# Patient Record
Sex: Female | Born: 1990 | Race: Black or African American | Hispanic: No | Marital: Single | Smoking: Current some day smoker
Health system: Southern US, Community
[De-identification: ages and names within clinical notes are randomized; demographics above are authoritative.]

## PROBLEM LIST (undated history)

## (undated) DIAGNOSIS — E119 Type 2 diabetes mellitus without complications: Secondary | ICD-10-CM

## (undated) DIAGNOSIS — J45909 Unspecified asthma, uncomplicated: Secondary | ICD-10-CM

---

## 2013-04-01 ENCOUNTER — Encounter (HOSPITAL_BASED_OUTPATIENT_CLINIC_OR_DEPARTMENT_OTHER): Payer: Self-pay | Admitting: Emergency Medicine

## 2013-04-01 ENCOUNTER — Emergency Department (HOSPITAL_BASED_OUTPATIENT_CLINIC_OR_DEPARTMENT_OTHER)
Admission: EM | Admit: 2013-04-01 | Discharge: 2013-04-01 | Disposition: A | Discharge status: Home / Self Care | Payer: Medicaid Other | Attending: Emergency Medicine | Admitting: Emergency Medicine

## 2013-04-01 DIAGNOSIS — E119 Type 2 diabetes mellitus without complications: Secondary | ICD-10-CM | POA: Insufficient documentation

## 2013-04-01 DIAGNOSIS — A499 Bacterial infection, unspecified: Secondary | ICD-10-CM | POA: Insufficient documentation

## 2013-04-01 DIAGNOSIS — J45909 Unspecified asthma, uncomplicated: Secondary | ICD-10-CM | POA: Insufficient documentation

## 2013-04-01 DIAGNOSIS — Z3202 Encounter for pregnancy test, result negative: Secondary | ICD-10-CM | POA: Insufficient documentation

## 2013-04-01 DIAGNOSIS — F17200 Nicotine dependence, unspecified, uncomplicated: Secondary | ICD-10-CM | POA: Insufficient documentation

## 2013-04-01 DIAGNOSIS — N760 Acute vaginitis: Secondary | ICD-10-CM | POA: Insufficient documentation

## 2013-04-01 DIAGNOSIS — B9689 Other specified bacterial agents as the cause of diseases classified elsewhere: Secondary | ICD-10-CM | POA: Insufficient documentation

## 2013-04-01 HISTORY — DX: Type 2 diabetes mellitus without complications: E11.9

## 2013-04-01 HISTORY — DX: Unspecified asthma, uncomplicated: J45.909

## 2013-04-01 LAB — URINALYSIS, ROUTINE W REFLEX MICROSCOPIC
Bilirubin Urine: NEGATIVE
GLUCOSE, UA: NEGATIVE mg/dL
Hgb urine dipstick: NEGATIVE
KETONES UR: NEGATIVE mg/dL
LEUKOCYTES UA: NEGATIVE
NITRITE: NEGATIVE
PROTEIN: NEGATIVE mg/dL
Specific Gravity, Urine: 1.026 (ref 1.005–1.030)
UROBILINOGEN UA: 1 mg/dL (ref 0.0–1.0)
pH: 6.5 (ref 5.0–8.0)

## 2013-04-01 LAB — WET PREP, GENITAL
Trich, Wet Prep: NONE SEEN
YEAST WET PREP: NONE SEEN

## 2013-04-01 LAB — PREGNANCY, URINE: Preg Test, Ur: NEGATIVE

## 2013-04-01 MED ORDER — METRONIDAZOLE 0.75 % VA GEL: 1.0000 | Freq: Two times a day (BID) | VAGINAL | Status: DC

## 2013-04-01 NOTE — Discharge Instructions (Signed)

## 2013-04-01 NOTE — ED Notes (Signed)
Vaginal d/c x 1 week  

## 2013-04-01 NOTE — ED Provider Notes (Signed)
CSN: [id number]     Arrival date & time 04/01/13  1302 History   First MD Initiated Contact with Patient 04/01/13 1501     Chief Complaint  Patient presents with  . Vaginal Discharge   (Consider location/radiation/quality/duration/timing/severity/associated sxs/prior Treatment) Patient is a 23 y.o. female presenting with vaginal discharge. The history is provided by the patient. No language interpreter was used.  Vaginal Discharge Quality:  White Severity:  Moderate Onset quality:  Gradual Duration:  2 days Timing:  Constant Progression:  Worsening Chronicity:  New Context: after intercourse   Relieved by:  Nothing Worsened by:  Nothing tried Ineffective treatments:  None tried Associated symptoms: no abdominal pain     Past Medical History  Diagnosis Date  . Diabetes mellitus without complication   . Asthma    History reviewed. No pertinent past surgical history. No family history on file. History  Substance Use Topics  . Smoking status: Current Every Day Smoker -- 0.50 packs/day    Types: Cigarettes  . Smokeless tobacco: Not on file  . Alcohol Use: Yes     Comment: occasional   OB History   Grav Para Term Preterm Abortions TAB SAB Ect Mult Living                 Review of Systems  Gastrointestinal: Negative for abdominal pain.  Genitourinary: Positive for vaginal discharge.  All other systems reviewed and are negative.    Allergies  Review of patient's allergies indicates no known allergies.  Home Medications  No current outpatient prescriptions on file. BP 126/68  Pulse 84  Temp(Src) 98.9 F (37.2 C) (Oral)  Resp 16  Ht 5\' 4"  (1.626 m)  Wt 170 lb (77.111 kg)  BMI 29.17 kg/m2  SpO2 100%  LMP 03/19/2013 Physical Exam  Nursing note and vitals reviewed. Constitutional: She appears well-developed and well-nourished.  HENT:  Head: Normocephalic and atraumatic.  Eyes: Pupils are equal, round, and reactive to light.  Neck: Normal range of motion.   Cardiovascular: Normal rate and regular rhythm.   Pulmonary/Chest: Effort normal and breath sounds normal.  Abdominal: Soft.  Genitourinary: Vaginal discharge found.  Musculoskeletal: Normal range of motion.  Neurological: She is alert.  Skin: Skin is warm.    ED Course  Procedures (including critical care time) Labs Review Labs Reviewed  WET PREP, GENITAL - Abnormal; Notable for the following:    Clue Cells Wet Prep HPF POC MANY (*)    WBC, Wet Prep HPF POC TOO NUMEROUS TO COUNT (*)    All other components within normal limits  GC/CHLAMYDIA PROBE AMP  URINALYSIS, ROUTINE W REFLEX MICROSCOPIC  PREGNANCY, URINE   Imaging Review No results found.  EKG Interpretation   None       MDM   1. BV (bacterial vaginosis)    metrogel vaginal  Elson AreasLeslie K Pedro Oldenburg, PA-C 04/01/13 1625

## 2013-04-02 LAB — GC/CHLAMYDIA PROBE AMP
CT Probe RNA: NEGATIVE
GC Probe RNA: NEGATIVE

## 2013-04-02 NOTE — ED Provider Notes (Signed)
History/physical exam/procedure(s) were performed by non-physician practitioner and as supervising physician I was immediately available for consultation/collaboration. I have reviewed all notes and am in agreement with care and plan.   Hilario Quarryanielle S Oneida Mckamey, MD 04/02/13 1400

## 2015-02-13 ENCOUNTER — Encounter (HOSPITAL_BASED_OUTPATIENT_CLINIC_OR_DEPARTMENT_OTHER): Payer: Self-pay | Admitting: *Deleted

## 2015-02-13 ENCOUNTER — Emergency Department (HOSPITAL_BASED_OUTPATIENT_CLINIC_OR_DEPARTMENT_OTHER)
Admission: EM | Admit: 2015-02-13 | Discharge: 2015-02-13 | Disposition: A | Discharge status: Home / Self Care | Payer: No Typology Code available for payment source | Attending: Emergency Medicine | Admitting: Emergency Medicine

## 2015-02-13 DIAGNOSIS — Y9389 Activity, other specified: Secondary | ICD-10-CM | POA: Insufficient documentation

## 2015-02-13 DIAGNOSIS — E119 Type 2 diabetes mellitus without complications: Secondary | ICD-10-CM | POA: Diagnosis not present

## 2015-02-13 DIAGNOSIS — J45909 Unspecified asthma, uncomplicated: Secondary | ICD-10-CM | POA: Insufficient documentation

## 2015-02-13 DIAGNOSIS — Y998 Other external cause status: Secondary | ICD-10-CM | POA: Insufficient documentation

## 2015-02-13 DIAGNOSIS — Z3202 Encounter for pregnancy test, result negative: Secondary | ICD-10-CM | POA: Insufficient documentation

## 2015-02-13 DIAGNOSIS — Y92410 Unspecified street and highway as the place of occurrence of the external cause: Secondary | ICD-10-CM | POA: Insufficient documentation

## 2015-02-13 DIAGNOSIS — Z792 Long term (current) use of antibiotics: Secondary | ICD-10-CM | POA: Diagnosis not present

## 2015-02-13 DIAGNOSIS — F17210 Nicotine dependence, cigarettes, uncomplicated: Secondary | ICD-10-CM | POA: Insufficient documentation

## 2015-02-13 DIAGNOSIS — S199XXA Unspecified injury of neck, initial encounter: Secondary | ICD-10-CM | POA: Diagnosis present

## 2015-02-13 DIAGNOSIS — S161XXA Strain of muscle, fascia and tendon at neck level, initial encounter: Secondary | ICD-10-CM | POA: Insufficient documentation

## 2015-02-13 LAB — PREGNANCY, URINE: PREG TEST UR: NEGATIVE

## 2015-02-13 MED ORDER — IBUPROFEN 800 MG PO TABS
800.0000 mg | ORAL_TABLET | Freq: Once | ORAL | Status: AC
  Administered 2015-02-13: 800 mg via ORAL
  Filled 2015-02-13: qty 1
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
## 2015-02-13 NOTE — ED Notes (Signed)
Patient stable and ambulatory. Patient verbalizes understanding of discharge instructions and follow-up. 

## 2017-04-16 ENCOUNTER — Emergency Department (HOSPITAL_BASED_OUTPATIENT_CLINIC_OR_DEPARTMENT_OTHER): Payer: BLUE CROSS/BLUE SHIELD

## 2017-04-16 ENCOUNTER — Other Ambulatory Visit: Payer: Self-pay

## 2017-04-16 ENCOUNTER — Emergency Department (HOSPITAL_BASED_OUTPATIENT_CLINIC_OR_DEPARTMENT_OTHER)
Admission: EM | Admit: 2017-04-16 | Discharge: 2017-04-16 | Disposition: A | Discharge status: Home / Self Care | Payer: BLUE CROSS/BLUE SHIELD | Attending: Emergency Medicine | Admitting: Emergency Medicine

## 2017-04-16 ENCOUNTER — Encounter (HOSPITAL_BASED_OUTPATIENT_CLINIC_OR_DEPARTMENT_OTHER): Payer: Self-pay | Admitting: *Deleted

## 2017-04-16 DIAGNOSIS — E119 Type 2 diabetes mellitus without complications: Secondary | ICD-10-CM | POA: Diagnosis not present

## 2017-04-16 DIAGNOSIS — J45909 Unspecified asthma, uncomplicated: Secondary | ICD-10-CM | POA: Insufficient documentation

## 2017-04-16 DIAGNOSIS — B9789 Other viral agents as the cause of diseases classified elsewhere: Secondary | ICD-10-CM

## 2017-04-16 DIAGNOSIS — R05 Cough: Secondary | ICD-10-CM | POA: Diagnosis present

## 2017-04-16 DIAGNOSIS — J069 Acute upper respiratory infection, unspecified: Secondary | ICD-10-CM | POA: Insufficient documentation

## 2017-04-16 DIAGNOSIS — F17210 Nicotine dependence, cigarettes, uncomplicated: Secondary | ICD-10-CM | POA: Insufficient documentation

## 2017-04-16 NOTE — Discharge Instructions (Signed)
Your symptoms are likely caused by an upper respiratory viral infection, chest x-ray shows no evidence of pneumonia, antibiotics are not helpful for bilateral injections please treat your symptoms supportively, make sure you are drinking plenty of fluids, ibuprofen or Tylenol for pain or fevers, Flonase and Zyrtec to help with nasal congestion, and over-the-counter cough syrups and throat lozenges to help with cough.  Please follow-up with primary care if symptoms are not improving after 5-7 days, if you have persistent fevers, chest pain, shortness of breath or other new or concerning symptoms return to the ED for reevaluation.

## 2017-04-16 NOTE — ED Provider Notes (Signed)
[facility] EMERGENCY DEPARTMENT Provider Note   CSN: [id number] Arrival date & time: 04/16/17  1721     History   Chief Complaint Chief Complaint  Patient presents with  . URI    HPI   Alexis Melendez is a 27 y.o. Female with a history of asthma and diabetes, presents to the ED for evaluation of 3 days of URI symptoms.  Since onset symptoms have been constant and gradually worsening.  Patient reports some chills but no fevers.  Patient has had runny nose and nasal congestion cough occasionally productive of mucus, sore and scratchy throat, and some ear pain, congestion is worse on the left than the right.  Patient reports muscle soreness from coughing, but denies any chest pain or shortness of breath.  No nausea, vomiting or diarrhea no abdominal pain.  Has tried Mucinex without improvement has not tried anything else to treat her symptoms, denies any other aggravating or alleviating factors.      Past Medical History:  Diagnosis Date  . Asthma   . Diabetes mellitus without complication (HCC)    pt states she is not diabetic    There are no active problems to display for this patient.   History reviewed. No pertinent surgical history.  OB History    No data available       Home Medications    Prior to Admission medications   Not on File    Family History History reviewed. No pertinent family history.  Social History Social History   Tobacco Use  . Smoking status: Current Every Day Smoker    Packs/day: 0.50    Types: Cigarettes  . Smokeless tobacco: Never Used  Substance Use Topics  . Alcohol use: Yes    Comment: occasional  . Drug use: Yes    Types: Marijuana     Allergies   Patient has no known allergies.   Review of Systems Review of Systems  Constitutional: Positive for chills. Negative for fever.  HENT: Positive for congestion, ear pain, rhinorrhea, sinus pressure, sinus pain and sore throat. Negative for trouble swallowing.     Eyes: Negative for discharge, redness and itching.  Respiratory: Positive for cough. Negative for chest tightness, shortness of breath and wheezing.   Cardiovascular: Negative for chest pain.  Gastrointestinal: Negative for abdominal pain, diarrhea, nausea and vomiting.  Musculoskeletal: Negative for neck pain.  Skin: Negative for rash.  Neurological: Negative for headaches.     Physical Exam Updated Vital Signs BP 128/66 (BP Location: Right Arm)   Pulse 84   Temp 98.6 F (37 C)   Resp 18   Ht 5\' 4"  (1.626 m)   Wt 90.7 kg (200 lb)   LMP 04/09/2017   SpO2 99%   BMI 34.33 kg/m   Physical Exam  Constitutional: She appears well-developed and well-nourished. No distress.  HENT:  Head: Normocephalic and atraumatic.  TMs clear with good landmarks, moderate nasal mucosa edema with clear rhinorrhea, posterior oropharynx clear and moist, with some erythema, no edema or exudates, uvula midline  Eyes: Right eye exhibits no discharge. Left eye exhibits no discharge.  Neck: Normal range of motion. Neck supple.  Cardiovascular: Normal rate, regular rhythm and normal heart sounds.  Pulmonary/Chest: Effort normal and breath sounds normal. No stridor. No respiratory distress. She has no wheezes. She has no rales.  Respirations equal and unlabored, patient able to speak in full sentences, lungs clear to auscultation bilaterally  Abdominal: Soft. Bowel sounds are normal. She exhibits no  distension and no mass. There is no tenderness.  Musculoskeletal: She exhibits no edema.  Lymphadenopathy:    She has no cervical adenopathy.  Neurological: She is alert. Coordination normal.  Skin: Skin is warm and dry. Capillary refill takes less than 2 seconds. She is not diaphoretic.  Psychiatric: She has a normal mood and affect. Her behavior is normal.  Nursing note and vitals reviewed.    ED Treatments / Results  Labs (all labs ordered are listed, but only abnormal results are displayed) Labs  Reviewed - No data to display  EKG  EKG Interpretation None       Radiology Dg Chest 2 View  Result Date: 04/16/2017 CLINICAL DATA:  27 year old female with a history of upper respiratory infection for 3 days EXAM: CHEST  2 VIEW COMPARISON:  None. FINDINGS: The heart size and mediastinal contours are within normal limits. Both lungs are clear. The visualized skeletal structures are unremarkable. IMPRESSION: Negative for acute cardiopulmonary disease Electronically Signed   By: Gilmer MorJaime  Wagner D.O.   On: 04/16/2017 21:43    Procedures Procedures (including critical care time)  Medications Ordered in ED Medications - No data to display   Initial Impression / Assessment and Plan / ED Course  I have reviewed the triage vital signs and the nursing notes.  Pertinent labs & imaging results that were available during my care of the patient were reviewed by me and considered in my medical decision making (see chart for details).  Pt presents with nasal congestion and cough. Pt is well appearing and vitals are normal. Lungs CTA on exam. Pt CXR negative for acute infiltrate. Patients symptoms are consistent with URI, likely viral etiology, city is onset less likely that this is the flu, regardless patient is outside the 48-hour window.. Discussed that antibiotics are not indicated for viral infections. Pt will be discharged with symptomatic treatment.  Verbalizes understanding and is agreeable with plan. Pt is hemodynamically stable & in NAD prior to dc.   Final Clinical Impressions(s) / ED Diagnoses   Final diagnoses:  Viral URI with cough    ED Discharge Orders    None       Legrand RamsFord, Kelsey N, PA-C 04/16/17 2242    Tegeler, Canary Brimhristopher J, MD 04/17/17 [phone]

## 2017-04-16 NOTE — ED Triage Notes (Signed)
Pt c/o URi symptoms x 3 days  

## 2017-06-20 ENCOUNTER — Encounter (HOSPITAL_BASED_OUTPATIENT_CLINIC_OR_DEPARTMENT_OTHER): Payer: Self-pay

## 2017-06-20 ENCOUNTER — Other Ambulatory Visit: Payer: Self-pay

## 2017-06-20 ENCOUNTER — Emergency Department (HOSPITAL_BASED_OUTPATIENT_CLINIC_OR_DEPARTMENT_OTHER)
Admission: EM | Admit: 2017-06-20 | Discharge: 2017-06-20 | Disposition: A | Discharge status: Home / Self Care | Payer: BLUE CROSS/BLUE SHIELD | Attending: Emergency Medicine | Admitting: Emergency Medicine

## 2017-06-20 DIAGNOSIS — N760 Acute vaginitis: Secondary | ICD-10-CM | POA: Diagnosis not present

## 2017-06-20 DIAGNOSIS — R1030 Lower abdominal pain, unspecified: Secondary | ICD-10-CM | POA: Diagnosis not present

## 2017-06-20 DIAGNOSIS — F1210 Cannabis abuse, uncomplicated: Secondary | ICD-10-CM | POA: Diagnosis not present

## 2017-06-20 DIAGNOSIS — J45909 Unspecified asthma, uncomplicated: Secondary | ICD-10-CM | POA: Diagnosis not present

## 2017-06-20 DIAGNOSIS — B9689 Other specified bacterial agents as the cause of diseases classified elsewhere: Secondary | ICD-10-CM | POA: Insufficient documentation

## 2017-06-20 DIAGNOSIS — F17210 Nicotine dependence, cigarettes, uncomplicated: Secondary | ICD-10-CM | POA: Insufficient documentation

## 2017-06-20 DIAGNOSIS — N898 Other specified noninflammatory disorders of vagina: Secondary | ICD-10-CM | POA: Insufficient documentation

## 2017-06-20 DIAGNOSIS — E119 Type 2 diabetes mellitus without complications: Secondary | ICD-10-CM | POA: Insufficient documentation

## 2017-06-20 LAB — WET PREP, GENITAL
Sperm: NONE SEEN
TRICH WET PREP: NONE SEEN
Yeast Wet Prep HPF POC: NONE SEEN

## 2017-06-20 LAB — CBC WITH DIFFERENTIAL/PLATELET
BASOS ABS: 0 10*3/uL (ref 0.0–0.1)
Basophils Relative: 0 %
EOS PCT: 0 %
Eosinophils Absolute: 0 10*3/uL (ref 0.0–0.7)
HCT: 37.2 % (ref 36.0–46.0)
Hemoglobin: 12.4 g/dL (ref 12.0–15.0)
LYMPHS ABS: 2.8 10*3/uL (ref 0.7–4.0)
Lymphocytes Relative: 42 %
MCH: 27.1 pg (ref 26.0–34.0)
MCHC: 33.3 g/dL (ref 30.0–36.0)
MCV: 81.4 fL (ref 78.0–100.0)
MONOS PCT: 8 %
Monocytes Absolute: 0.5 10*3/uL (ref 0.1–1.0)
Neutro Abs: 3.4 10*3/uL (ref 1.7–7.7)
Neutrophils Relative %: 50 %
Platelets: 254 10*3/uL (ref 150–400)
RBC: 4.57 MIL/uL (ref 3.87–5.11)
RDW: 12.4 % (ref 11.5–15.5)
WBC: 6.7 10*3/uL (ref 4.0–10.5)

## 2017-06-20 LAB — BASIC METABOLIC PANEL
ANION GAP: 9 (ref 5–15)
BUN: 9 mg/dL (ref 6–20)
CO2: 21 mmol/L — AB (ref 22–32)
Calcium: 8.6 mg/dL — ABNORMAL LOW (ref 8.9–10.3)
Chloride: 106 mmol/L (ref 101–111)
Creatinine, Ser: 0.81 mg/dL (ref 0.44–1.00)
GFR calc non Af Amer: >60 mL/min (ref >60)
GLUCOSE: 88 mg/dL (ref 65–99)
Potassium: 3.6 mmol/L (ref 3.5–5.1)
Sodium: 136 mmol/L (ref 135–145)

## 2017-06-20 LAB — URINALYSIS, ROUTINE W REFLEX MICROSCOPIC
Bilirubin Urine: NEGATIVE
GLUCOSE, UA: NEGATIVE mg/dL
HGB URINE DIPSTICK: NEGATIVE
Ketones, ur: NEGATIVE mg/dL
Leukocytes, UA: NEGATIVE
Nitrite: NEGATIVE
PH: 6.5 (ref 5.0–8.0)
Protein, ur: NEGATIVE mg/dL
SPECIFIC GRAVITY, URINE: 1.02 (ref 1.005–1.030)

## 2017-06-20 LAB — PREGNANCY, URINE: Preg Test, Ur: NEGATIVE

## 2017-06-20 MED ORDER — METRONIDAZOLE 500 MG PO TABS: 500.0000 mg | ORAL_TABLET | Freq: Two times a day (BID) | ORAL | 0 refills | Status: DC

## 2017-06-20 NOTE — ED Triage Notes (Signed)
Pt presents to the ED with a CC of intermittent abdominal pain (left) that began 4 days ago. NO period in 4 months with negative home HCG test. Pt concerned about possible pregnancy.

## 2017-06-20 NOTE — ED Provider Notes (Signed)
[facility] EMERGENCY DEPARTMENT Provider Note   CSN: [id number] Arrival date & time: 06/20/17  1224     History   Chief Complaint Chief Complaint  Patient presents with  . Abdominal Pain    HPI Alexis Melendez is a 27 y.o. female.  HPI Patient presents with lower abdominal pain.  Has had for the last couple days.  Dull in her lower abdomens.  Comes and goes somewhat.  States she has not had a period in the last 4 months.  States this is somewhat unusual for her.  States she has been treated for BV by the health department.  No diarrhea or constipation.  States she has had a decreased appetite.  Pain not changed with eating.  She states she has had a lot of stress in her life.  Has had a little bit of vaginal discharge and feels as if she could have BV again.  Patient states her brother and her mother are both likely to go to jail for 5 years and she think stress may have some to do with this. Past Medical History:  Diagnosis Date  . Asthma   . Diabetes mellitus without complication (HCC)    pt states she is not diabetic    There are no active problems to display for this patient.   History reviewed. No pertinent surgical history.   OB History   None      Home Medications    Prior to Admission medications   Medication Sig Start Date End Date Taking? Authorizing Provider  metroNIDAZOLE (FLAGYL) 500 MG tablet Take 1 tablet (500 mg total) by mouth 2 (two) times daily. 06/20/17   Benjiman Core, MD    Family History History reviewed. No pertinent family history.  Social History Social History   Tobacco Use  . Smoking status: Current Every Day Smoker    Packs/day: 0.50    Types: Cigarettes  . Smokeless tobacco: Never Used  Substance Use Topics  . Alcohol use: Yes    Comment: occasional  . Drug use: Yes    Types: Marijuana     Allergies   Patient has no known allergies.   Review of Systems Review of Systems  Constitutional: Positive for  appetite change.  HENT: Negative for congestion.   Respiratory: Negative for shortness of breath.   Cardiovascular: Negative for chest pain.  Gastrointestinal: Positive for abdominal pain. Negative for constipation, diarrhea, nausea and vomiting.  Genitourinary: Positive for menstrual problem and vaginal discharge.  Musculoskeletal: Negative for back pain.  Skin: Negative for rash.  Neurological: Negative for weakness.  Hematological: Negative for adenopathy.  Psychiatric/Behavioral: Negative for confusion.     Physical Exam Updated Vital Signs BP 107/66 (BP Location: Right Arm)   Pulse 90   Temp 98.3 F (36.8 C) (Oral)   Resp 18   Ht 5\' 4"  (1.626 m)   Wt 94.8 kg (209 lb)   LMP 04/09/2017   SpO2 99%   BMI 35.87 kg/m   Physical Exam  Constitutional: She appears well-developed.  HENT:  Head: Normocephalic.  Eyes: EOM are normal.  Cardiovascular: Normal rate.  Pulmonary/Chest: Breath sounds normal.  Abdominal: Normal appearance.  Mild suprapubic tenderness without rebound or guarding.  Genitourinary:  Genitourinary Comments: Pelvic exam showed white vaginal discharge.  Did have some erythema at cervical loss.  No cervical motion tenderness or adnexal tenderness.  Neurological: She is alert.  Skin: Skin is warm. Capillary refill takes less than 2 seconds.     ED  Treatments / Results  Labs (all labs ordered are listed, but only abnormal results are displayed) Labs Reviewed  WET PREP, GENITAL - Abnormal; Notable for the following components:      Result Value   Clue Cells Wet Prep HPF POC PRESENT (*)    WBC, Wet Prep HPF POC MODERATE (*)    All other components within normal limits  BASIC METABOLIC PANEL - Abnormal; Notable for the following components:   CO2 21 (*)    Calcium 8.6 (*)    All other components within normal limits  URINALYSIS, ROUTINE W REFLEX MICROSCOPIC  PREGNANCY, URINE  CBC WITH DIFFERENTIAL/PLATELET  RPR  HIV ANTIBODY (ROUTINE TESTING)    PATHOLOGIST SMEAR REVIEW  GC/CHLAMYDIA PROBE AMP ([place]) NOT AT [facility]    EKG None  Radiology No results found.  Procedures Procedures (including critical care time)  Medications Ordered in ED Medications - No data to display   Initial Impression / Assessment and Plan / ED Course  I have reviewed the triage vital signs and the nursing notes.  Pertinent labs & imaging results that were available during my care of the patient were reviewed by me and considered in my medical decision making (see chart for details).     Patient with lower abdominal pain.  Rather benign exam.  Labs reassuring.  Potential bacterial vaginosis.  Will treat.  Discharge home. Final Clinical Impressions(s) / ED Diagnoses   Final diagnoses:  Lower abdominal pain  Bacterial vaginosis    ED Discharge Orders        Ordered    metroNIDAZOLE (FLAGYL) 500 MG tablet  2 times daily     06/20/17 1629       Benjiman CorePickering, Elsey Holts, MD 06/20/17 1646

## 2017-06-20 NOTE — ED Notes (Signed)
Assumed care of patient from Amy, RN. Pt resting quietly. Awaiting Pelvic Exam by EDP.

## 2017-06-21 LAB — HIV ANTIBODY (ROUTINE TESTING W REFLEX): HIV SCREEN 4TH GENERATION: NONREACTIVE

## 2017-06-21 LAB — PATHOLOGIST SMEAR REVIEW: Path Review: REACTIVE

## 2017-06-21 LAB — GC/CHLAMYDIA PROBE AMP (~~LOC~~) NOT AT ARMC
Chlamydia: NEGATIVE
Neisseria Gonorrhea: NEGATIVE

## 2017-06-21 LAB — RPR: RPR Ser Ql: NONREACTIVE

## 2019-01-10 IMAGING — DX DG CHEST 2V
2 series · 2 of 2 positions shown · non-contrast
Comparison: None.

CLINICAL DATA: 26-year-old female with a history of upper
respiratory infection for 3 days

EXAM:
CHEST  2 VIEW

[chest pa]
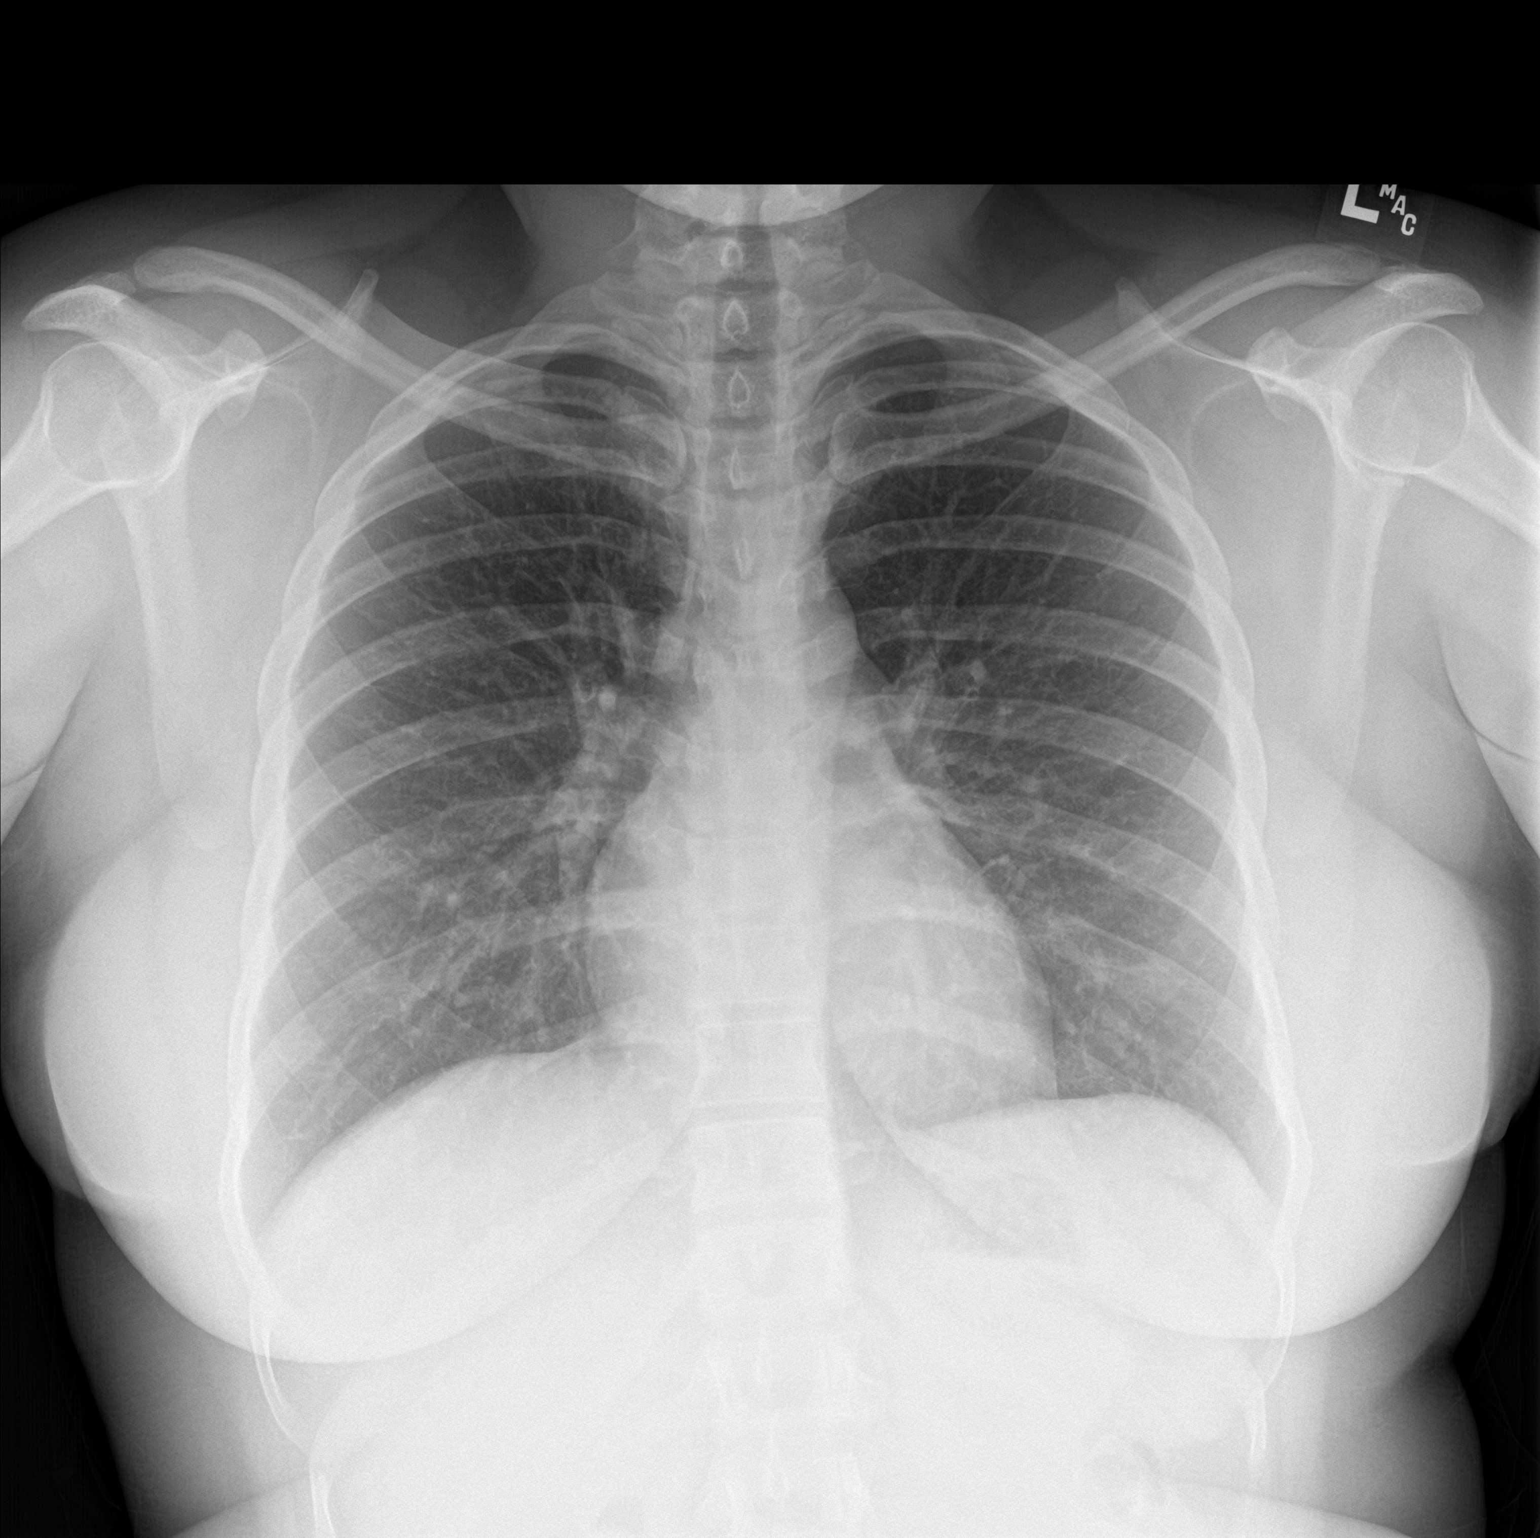

[chest lat]
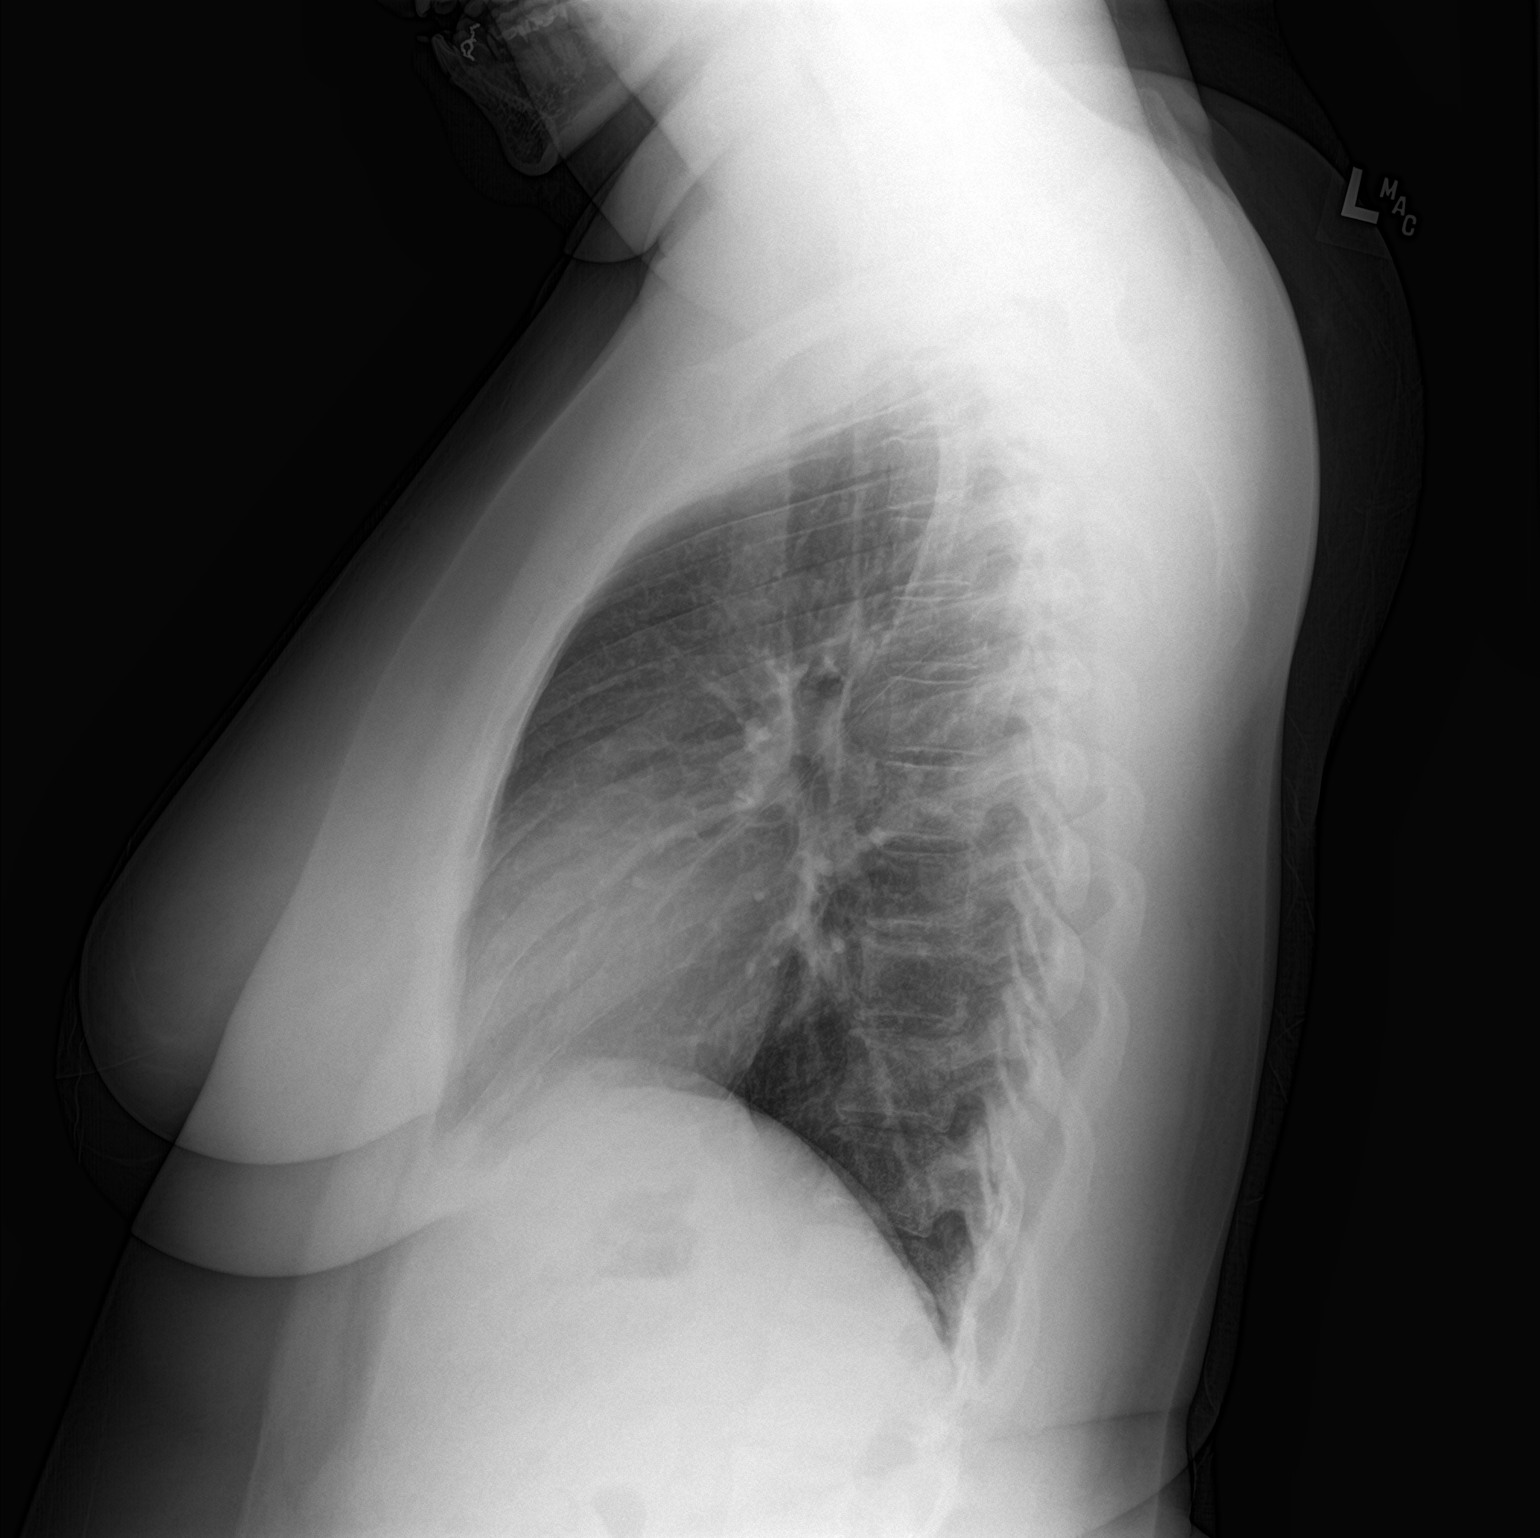

[2 of 2 positions shown; findings below may reference images not displayed]

FINDINGS: The heart size and mediastinal contours are within normal limits.
Both lungs are clear. The visualized skeletal structures are
unremarkable.
IMPRESSION: Negative for acute cardiopulmonary disease

## 2019-08-11 ENCOUNTER — Emergency Department (HOSPITAL_BASED_OUTPATIENT_CLINIC_OR_DEPARTMENT_OTHER)
Admission: EM | Admit: 2019-08-11 | Discharge: 2019-08-11 | Disposition: A | Discharge status: Home / Self Care | Payer: BLUE CROSS/BLUE SHIELD | Attending: Emergency Medicine | Admitting: Emergency Medicine

## 2019-08-11 ENCOUNTER — Other Ambulatory Visit: Payer: Self-pay

## 2019-08-11 ENCOUNTER — Encounter (HOSPITAL_BASED_OUTPATIENT_CLINIC_OR_DEPARTMENT_OTHER): Payer: Self-pay

## 2019-08-11 DIAGNOSIS — Z91040 Latex allergy status: Secondary | ICD-10-CM | POA: Insufficient documentation

## 2019-08-11 DIAGNOSIS — J45909 Unspecified asthma, uncomplicated: Secondary | ICD-10-CM | POA: Insufficient documentation

## 2019-08-11 DIAGNOSIS — A749 Chlamydial infection, unspecified: Secondary | ICD-10-CM | POA: Insufficient documentation

## 2019-08-11 DIAGNOSIS — A5901 Trichomonal vulvovaginitis: Secondary | ICD-10-CM

## 2019-08-11 DIAGNOSIS — Z91018 Allergy to other foods: Secondary | ICD-10-CM | POA: Insufficient documentation

## 2019-08-11 DIAGNOSIS — Z87891 Personal history of nicotine dependence: Secondary | ICD-10-CM | POA: Insufficient documentation

## 2019-08-11 DIAGNOSIS — A64 Unspecified sexually transmitted disease: Secondary | ICD-10-CM

## 2019-08-11 LAB — URINALYSIS, MICROSCOPIC (REFLEX)

## 2019-08-11 LAB — URINALYSIS, ROUTINE W REFLEX MICROSCOPIC
Bilirubin Urine: NEGATIVE
Glucose, UA: NEGATIVE mg/dL
Hgb urine dipstick: NEGATIVE
Ketones, ur: NEGATIVE mg/dL
Nitrite: NEGATIVE
Protein, ur: NEGATIVE mg/dL
Specific Gravity, Urine: 1.02 (ref 1.005–1.030)
pH: 6.5 (ref 5.0–8.0)

## 2019-08-11 LAB — WET PREP, GENITAL
Sperm: NONE SEEN
Yeast Wet Prep HPF POC: NONE SEEN

## 2019-08-11 LAB — PREGNANCY, URINE: Preg Test, Ur: NEGATIVE

## 2019-08-11 MED ORDER — AZITHROMYCIN 1 G PO PACK
1.0000 g | PACK | Freq: Once | ORAL | Status: AC
  Administered 2019-08-11: 1 g via ORAL
  Filled 2019-08-11: qty 1

## 2019-08-11 MED ORDER — CEFTRIAXONE SODIUM 500 MG IJ SOLR
500.0000 mg | Freq: Once | INTRAMUSCULAR | Status: AC
  Administered 2019-08-11: 500 mg via INTRAMUSCULAR
  Filled 2019-08-11: qty 500

## 2019-08-11 MED ORDER — METRONIDAZOLE 500 MG PO TABS
2000.0000 mg | ORAL_TABLET | Freq: Once | ORAL | Status: AC
  Administered 2019-08-11: 2000 mg via ORAL
  Filled 2019-08-11: qty 4

## 2019-08-11 NOTE — ED Provider Notes (Signed)
[facility] EMERGENCY DEPARTMENT Provider Note   CSN: [id number] Arrival date & time: 08/11/19  1517     History Chief Complaint  Patient presents with  . Vaginal Discharge    Alexis Melendez is a 29 y.o. female.  29 y/o F with asthma presenting to the ER for complaints of vaginal discharge and possible STD exposure. Reports that her female sexual partner was just seen here for penile discharge and STD testing was sent out. Unknown results. She has been having vaginal discharge but no dysuria, n/v/d, abdominal pain, fever.         Past Medical History:  Diagnosis Date  . Asthma     There are no problems to display for this patient.   History reviewed. No pertinent surgical history.   OB History   No obstetric history on file.     No family history on file.  Social History   Tobacco Use  . Smoking status: Former Smoker    Packs/day: 0.50    Types: Cigarettes, Cigars  . Smokeless tobacco: Never Used  Substance Use Topics  . Alcohol use: Yes    Comment: occasional  . Drug use: Not Currently    Types: Marijuana    Home Medications Prior to Admission medications   Medication Sig Start Date End Date Taking? Authorizing Provider  metroNIDAZOLE (FLAGYL) 500 MG tablet Take 1 tablet (500 mg total) by mouth 2 (two) times daily. 06/20/17   Benjiman Core, MD    Allergies    Black walnut flavor and Latex  Review of Systems   Review of Systems  Constitutional: Negative for chills and fever.  Respiratory: Negative for cough and shortness of breath.   Gastrointestinal: Negative for abdominal pain.  Genitourinary: Positive for vaginal discharge. Negative for dysuria, menstrual problem, pelvic pain and vaginal bleeding.  Musculoskeletal: Negative for back pain.  Skin: Negative for rash.    Physical Exam Updated Vital Signs BP (!) 121/94 (BP Location: Left Arm)   Pulse 96   Temp 98.8 F (37.1 C) (Oral)   Resp 18   Ht 5\' 4"  (1.626 m)   Wt 97.1 kg    SpO2 96%   BMI 36.73 kg/m   Physical Exam Vitals and nursing note reviewed. Exam conducted with a chaperone present.  Constitutional:      General: She is not in acute distress.    Appearance: Normal appearance. She is obese. She is not ill-appearing, toxic-appearing or diaphoretic.  HENT:     Head: Normocephalic.  Eyes:     Conjunctiva/sclera: Conjunctivae normal.  Pulmonary:     Effort: Pulmonary effort is normal.  Genitourinary:    General: Normal vulva.     Vagina: Vaginal discharge present. No erythema, tenderness or bleeding.     Cervix: No cervical motion tenderness, discharge, erythema or cervical bleeding.  Skin:    General: Skin is dry.  Neurological:     Mental Status: She is alert.  Psychiatric:        Mood and Affect: Mood normal.     ED Results / Procedures / Treatments   Labs (all labs ordered are listed, but only abnormal results are displayed) Labs Reviewed  WET PREP, GENITAL - Abnormal; Notable for the following components:      Result Value   Trich, Wet Prep PRESENT (*)    Clue Cells Wet Prep HPF POC PRESENT (*)    WBC, Wet Prep HPF POC MANY (*)    All other components within normal limits  URINALYSIS, ROUTINE W REFLEX MICROSCOPIC - Abnormal; Notable for the following components:   Leukocytes,Ua SMALL (*)    All other components within normal limits  URINALYSIS, MICROSCOPIC (REFLEX) - Abnormal; Notable for the following components:   Bacteria, UA FEW (*)    Trichomonas, UA PRESENT (*)    All other components within normal limits  PREGNANCY, URINE  GC/CHLAMYDIA PROBE AMP () NOT AT [facility]    EKG None  Radiology No results found.  Procedures Procedures (including critical care time)  Medications Ordered in ED Medications  cefTRIAXone (ROCEPHIN) injection 500 mg (has no administration in time range)  azithromycin (ZITHROMAX) powder 1 g (has no administration in time range)  metroNIDAZOLE (FLAGYL) tablet 2,000 mg (has no  administration in time range)    ED Course  I have reviewed the triage vital signs and the nursing notes.  Pertinent labs & imaging results that were available during my care of the patient were reviewed by me and considered in my medical decision making (see chart for details).  Clinical Course as of Aug 11 1623  Mon Aug 11, 2019  1624 Patient with vaginal d/c after possible STD exposure. Well appearing w/o signs of PID on exam. Wet prep + for trich. Explained to patient treatment for this and gc/chlamydia and discussed safe sex practices with her and her partner   [KM]    Clinical Course User Index [KM] Kristine Royal   MDM Rules/Calculators/A&P                      Based on review of vitals, medical screening exam, lab work and/or imaging, there does not appear to be an acute, emergent etiology for the patient's symptoms. Counseled pt on good return precautions and encouraged both PCP and ED follow-up as needed.  Prior to discharge, I also discussed incidental imaging findings with patient in detail and advised appropriate, recommended follow-up in detail.  Clinical Impression: 1. Vaginal trichomoniasis   2. STD (female)     Disposition: Discharge  Prior to providing a prescription for a controlled substance, I independently reviewed the patient's recent prescription history on the [facility]. The patient had no recent or regular prescriptions and was deemed appropriate for a brief, less than 3 day prescription of narcotic for acute analgesia.  This note was prepared with assistance of Systems analyst. Occasional wrong-word or sound-a-like substitutions may have occurred due to the inherent limitations of voice recognition software.  Final Clinical Impression(s) / ED Diagnoses Final diagnoses:  Vaginal trichomoniasis  STD (female)    Rx / DC Orders ED Discharge Orders    None       Kristine Royal 08/11/19 1625    Truddie Hidden, MD 08/11/19 1733

## 2019-08-11 NOTE — ED Triage Notes (Signed)
Pt c/o vaginal d/c x 1 week with +STD exposure-NAD-steady gait

## 2019-08-11 NOTE — Discharge Instructions (Addendum)
You tested positive for an STD today. We have treated you for this. No sexual activity until all partners have been treated and no longer have symptoms. It is important to be re-tested to be sure the medications have worked. Thank you for allowing me to care for you today. Please return to the emergency department if you have new or worsening symptoms. Take your medications as instructed.

## 2019-08-12 LAB — GC/CHLAMYDIA PROBE AMP (~~LOC~~) NOT AT ARMC
Chlamydia: POSITIVE — AB
Comment: NEGATIVE
Comment: NORMAL
Neisseria Gonorrhea: NEGATIVE

## 2020-02-08 ENCOUNTER — Emergency Department (HOSPITAL_BASED_OUTPATIENT_CLINIC_OR_DEPARTMENT_OTHER)
Admission: EM | Admit: 2020-02-08 | Discharge: 2020-02-08 | Disposition: A | Discharge status: Home / Self Care | Payer: Self-pay | Attending: Emergency Medicine | Admitting: Emergency Medicine

## 2020-02-08 ENCOUNTER — Other Ambulatory Visit: Payer: Self-pay

## 2020-02-08 ENCOUNTER — Encounter (HOSPITAL_BASED_OUTPATIENT_CLINIC_OR_DEPARTMENT_OTHER): Payer: Self-pay | Admitting: Emergency Medicine

## 2020-02-08 DIAGNOSIS — R1030 Lower abdominal pain, unspecified: Secondary | ICD-10-CM | POA: Insufficient documentation

## 2020-02-08 DIAGNOSIS — Z5321 Procedure and treatment not carried out due to patient leaving prior to being seen by health care provider: Secondary | ICD-10-CM | POA: Insufficient documentation

## 2020-02-08 LAB — PREGNANCY, URINE: Preg Test, Ur: NEGATIVE

## 2020-02-08 NOTE — ED Notes (Signed)
No answer X 3 when called to update vitals.

## 2020-02-08 NOTE — ED Triage Notes (Signed)
Pt arrives pov with driver endorsing concern for miscarriage. Pt reports Last period 10/1. Pt states heavy bleeding 11/29. Not currently bleeding. Endorses lower abdominal cramping. Pt also reports concern for STD.

## 2020-02-12 ENCOUNTER — Encounter (HOSPITAL_BASED_OUTPATIENT_CLINIC_OR_DEPARTMENT_OTHER): Payer: Self-pay | Admitting: *Deleted

## 2020-02-12 ENCOUNTER — Emergency Department (HOSPITAL_BASED_OUTPATIENT_CLINIC_OR_DEPARTMENT_OTHER)
Admission: EM | Admit: 2020-02-12 | Discharge: 2020-02-12 | Disposition: A | Discharge status: Home / Self Care | Payer: Self-pay | Attending: Emergency Medicine | Admitting: Emergency Medicine

## 2020-02-12 ENCOUNTER — Other Ambulatory Visit: Payer: Self-pay

## 2020-02-12 DIAGNOSIS — J45909 Unspecified asthma, uncomplicated: Secondary | ICD-10-CM | POA: Insufficient documentation

## 2020-02-12 DIAGNOSIS — Z91040 Latex allergy status: Secondary | ICD-10-CM | POA: Insufficient documentation

## 2020-02-12 DIAGNOSIS — Z711 Person with feared health complaint in whom no diagnosis is made: Secondary | ICD-10-CM

## 2020-02-12 DIAGNOSIS — Z87891 Personal history of nicotine dependence: Secondary | ICD-10-CM | POA: Insufficient documentation

## 2020-02-12 DIAGNOSIS — N760 Acute vaginitis: Secondary | ICD-10-CM

## 2020-02-12 DIAGNOSIS — Z202 Contact with and (suspected) exposure to infections with a predominantly sexual mode of transmission: Secondary | ICD-10-CM | POA: Insufficient documentation

## 2020-02-12 DIAGNOSIS — B9689 Other specified bacterial agents as the cause of diseases classified elsewhere: Secondary | ICD-10-CM | POA: Insufficient documentation

## 2020-02-12 LAB — CBC WITH DIFFERENTIAL/PLATELET
Abs Immature Granulocytes: 0.01 10*3/uL (ref 0.00–0.07)
Basophils Absolute: 0 10*3/uL (ref 0.0–0.1)
Basophils Relative: 0 %
Eosinophils Absolute: 0.1 10*3/uL (ref 0.0–0.5)
Eosinophils Relative: 2 %
HCT: 37.8 % (ref 36.0–46.0)
Hemoglobin: 12.2 g/dL (ref 12.0–15.0)
Immature Granulocytes: 0 %
Lymphocytes Relative: 42 %
Lymphs Abs: 2.7 10*3/uL (ref 0.7–4.0)
MCH: 27 pg (ref 26.0–34.0)
MCHC: 32.3 g/dL (ref 30.0–36.0)
MCV: 83.6 fL (ref 80.0–100.0)
Monocytes Absolute: 0.4 10*3/uL (ref 0.1–1.0)
Monocytes Relative: 6 %
Neutro Abs: 3.3 10*3/uL (ref 1.7–7.7)
Neutrophils Relative %: 50 %
Platelets: 400 10*3/uL (ref 150–400)
RBC: 4.52 MIL/uL (ref 3.87–5.11)
RDW: 12.7 % (ref 11.5–15.5)
WBC: 6.5 10*3/uL (ref 4.0–10.5)
nRBC: 0 % (ref 0.0–0.2)

## 2020-02-12 LAB — URINALYSIS, ROUTINE W REFLEX MICROSCOPIC
Bilirubin Urine: NEGATIVE
Glucose, UA: NEGATIVE mg/dL
Hgb urine dipstick: NEGATIVE
Ketones, ur: NEGATIVE mg/dL
Nitrite: NEGATIVE
Protein, ur: NEGATIVE mg/dL
Specific Gravity, Urine: >1.03 — ABNORMAL HIGH (ref 1.005–1.030)
pH: 6 (ref 5.0–8.0)

## 2020-02-12 LAB — URINALYSIS, MICROSCOPIC (REFLEX)

## 2020-02-12 LAB — WET PREP, GENITAL
Sperm: NONE SEEN
Trich, Wet Prep: NONE SEEN
Yeast Wet Prep HPF POC: NONE SEEN

## 2020-02-12 LAB — BASIC METABOLIC PANEL
Anion gap: 6 (ref 5–15)
BUN: 10 mg/dL (ref 6–20)
CO2: 27 mmol/L (ref 22–32)
Calcium: 9.3 mg/dL (ref 8.9–10.3)
Chloride: 106 mmol/L (ref 98–111)
Creatinine, Ser: 0.76 mg/dL (ref 0.44–1.00)
GFR, Estimated: >60 mL/min (ref >60)
Glucose, Bld: 105 mg/dL — ABNORMAL HIGH (ref 70–99)
Potassium: 3.5 mmol/L (ref 3.5–5.1)
Sodium: 139 mmol/L (ref 135–145)

## 2020-02-12 MED ORDER — LIDOCAINE HCL (PF) 1 % IJ SOLN
1.0000 mL | Freq: Once | INTRAMUSCULAR | Status: AC
  Administered 2020-02-12: 1 mL
  Filled 2020-02-12: qty 5

## 2020-02-12 MED ORDER — METRONIDAZOLE 500 MG PO TABS
500.0000 mg | ORAL_TABLET | Freq: Two times a day (BID) | ORAL | 0 refills | Status: DC
Start: 2020-02-12 — End: 2020-09-30

## 2020-02-12 MED ORDER — METRONIDAZOLE 500 MG PO TABS
500.0000 mg | ORAL_TABLET | Freq: Once | ORAL | Status: AC
  Administered 2020-02-12: 500 mg via ORAL
  Filled 2020-02-12: qty 1

## 2020-02-12 MED ORDER — DOXYCYCLINE HYCLATE 100 MG PO CAPS: 100.0000 mg | ORAL_CAPSULE | Freq: Two times a day (BID) | ORAL | 0 refills | Status: AC

## 2020-02-12 MED ORDER — DOXYCYCLINE HYCLATE 100 MG PO TABS
100.0000 mg | ORAL_TABLET | Freq: Once | ORAL | Status: AC
  Administered 2020-02-12: 100 mg via ORAL
  Filled 2020-02-12: qty 1

## 2020-02-12 MED ORDER — CEFTRIAXONE SODIUM 500 MG IJ SOLR
500.0000 mg | Freq: Once | INTRAMUSCULAR | Status: AC
  Administered 2020-02-12: 500 mg via INTRAMUSCULAR
  Filled 2020-02-12: qty 500

## 2020-02-12 NOTE — ED Triage Notes (Addendum)
She was here 4 days ago for c/o heavy vaginal bleeding and possible STD. She left before being seen. Here today for possible STD. No vaginal bleeding. Here pregnancy test was negative 4 days ago.

## 2020-02-12 NOTE — Discharge Instructions (Addendum)
You have been seen today in the Emergency Department (ED) for concern for STD  Your workup today shows that you have bacterial vaginosis.  -Prescription sent to pharmacy for Flagyl.  This medication should not be mixed with alcohol.  It will make you have severe vomiting   -You were treated for possible gonorrhea with a shot of Rocephin.  This is the only treatment needed. -You are also given prescription for doxycycline.  If your chlamydia test is positive you need to take all of these pills.  If the chlamydia test is negative you do not need to take them.   -You will need to let your partner know if your STD test are positive.  You will receive a phone call if any of your results are positive.  All results will be available for you to see in your MyChart.  Please follow up with your doctor as soon as possible regarding today's ED visit and your symptoms.   Return to the ED if your pain worsens, you develop a fever, or for any other symptoms that concern you.

## 2020-02-12 NOTE — ED Provider Notes (Signed)
[facility] EMERGENCY DEPARTMENT Provider Note   CSN: [id number] Arrival date & time: 02/12/20  1609     History Chief Complaint  Patient presents with  . SEXUALLY TRANSMITTED DISEASE    Alexis Melendez is a 29 y.o. female with past medical history significant for asthma.  HPI Patient presents to emergency department today with chief complaint of concern for sexually transmitted disease.  Patient states she she wants to be checked because the person she was talking to in the past has suddenly stopped talking to her.  She states had not been sexually active in approximately 2 months.  She does admit to having white vaginal discharge.  She states she has a history of frequent bacterial vaginosis infections.  She also states she came to the emergency department a week ago for vaginal bleeding.  She states at that time she had had heavy vaginal bleeding x1 week.  She was also endorsing low back pain and thought she might be having a miscarriage.  She not take any pregnancy tests at home prior to arrival.  A pregnancy test was collected in triage and result was negative.  Patient did not stay to be seen by a provider.  She states her vaginal bleeding has since resolved.  She denies any fever, chills, abdominal pain, nausea, emesis, back pain, pelvic pain, rash.    Past Medical History:  Diagnosis Date  . Asthma     There are no problems to display for this patient.   History reviewed. No pertinent surgical history.   OB History   No obstetric history on file.     No family history on file.  Social History   Tobacco Use  . Smoking status: Former Smoker    Packs/day: 0.50    Types: Cigarettes, Cigars  . Smokeless tobacco: Never Used  Vaping Use  . Vaping Use: Never used  Substance Use Topics  . Alcohol use: Yes    Comment: occasional  . Drug use: Not Currently    Types: Marijuana    Home Medications Prior to Admission medications   Medication Sig Start Date  End Date Taking? Authorizing Provider  doxycycline (VIBRAMYCIN) 100 MG capsule Take 1 capsule (100 mg total) by mouth 2 (two) times daily for 7 days. 02/13/20 02/20/20  Walisiewicz, Caroleen Hamman, PA-C  metroNIDAZOLE (FLAGYL) 500 MG tablet Take 1 tablet (500 mg total) by mouth 2 (two) times daily. 02/12/20   Shanon Ace, PA-C    Allergies    Black walnut flavor and Latex  Review of Systems   Review of Systems All other systems are reviewed and are negative for acute change except as noted in the HPI.  Physical Exam Updated Vital Signs BP 133/80   Pulse 82   Temp 98.6 F (37 C) (Oral)   Resp 18   Ht 5\' 4"  (1.626 m)   Wt 106.6 kg   SpO2 98%   BMI 40.34 kg/m   Physical Exam Vitals and nursing note reviewed.  Constitutional:      General: She is not in acute distress.    Appearance: She is not ill-appearing.  HENT:     Head: Normocephalic and atraumatic.     Right Ear: Tympanic membrane and external ear normal.     Left Ear: Tympanic membrane and external ear normal.     Nose: Nose normal.     Mouth/Throat:     Mouth: Mucous membranes are moist.     Pharynx: Oropharynx is clear.  Eyes:     General: No scleral icterus.       Right eye: No discharge.        Left eye: No discharge.     Extraocular Movements: Extraocular movements intact.     Conjunctiva/sclera: Conjunctivae normal.     Pupils: Pupils are equal, round, and reactive to light.  Neck:     Vascular: No JVD.  Cardiovascular:     Rate and Rhythm: Normal rate and regular rhythm.     Pulses: Normal pulses.          Radial pulses are 2+ on the right side and 2+ on the left side.     Heart sounds: Normal heart sounds.  Pulmonary:     Comments: Lungs clear to auscultation in all fields. Symmetric chest rise. No wheezing, rales, or rhonchi. Abdominal:     Comments: Abdomen is soft, non-distended, and non-tender in all quadrants. No rigidity, no guarding. No peritoneal signs.  Genitourinary:    Comments:  Normal external genitalia. No pain with speculum insertion. Closed cervical os with normal appearance - no rash or lesions. No bleeding noted from cervix or in vaginal vault.  Thin white discharge seen in vaginal vault. On bimanual examination no adnexal tenderness or cervical motion tenderness. Chaperone Magenta RN present during exam.  Musculoskeletal:        General: Normal range of motion.     Cervical back: Normal range of motion.  Skin:    General: Skin is warm and dry.     Capillary Refill: Capillary refill takes less than 2 seconds.  Neurological:     Mental Status: She is oriented to person, place, and time.     GCS: GCS eye subscore is 4. GCS verbal subscore is 5. GCS motor subscore is 6.     Comments: Fluent speech, no facial droop.  Psychiatric:        Behavior: Behavior normal.     ED Results / Procedures / Treatments   Labs (all labs ordered are listed, but only abnormal results are displayed) Labs Reviewed  WET PREP, GENITAL - Abnormal; Notable for the following components:      Result Value   Clue Cells Wet Prep HPF POC PRESENT (*)    WBC, Wet Prep HPF POC RARE (*)    All other components within normal limits  BASIC METABOLIC PANEL - Abnormal; Notable for the following components:   Glucose, Bld 105 (*)    All other components within normal limits  URINALYSIS, ROUTINE W REFLEX MICROSCOPIC - Abnormal; Notable for the following components:   APPearance CLOUDY (*)    Specific Gravity, Urine >1.030 (*)    Leukocytes,Ua TRACE (*)    All other components within normal limits  URINALYSIS, MICROSCOPIC (REFLEX) - Abnormal; Notable for the following components:   Bacteria, UA MANY (*)    All other components within normal limits  CBC WITH DIFFERENTIAL/PLATELET  RPR  HIV ANTIBODY (ROUTINE TESTING W REFLEX)  GC/CHLAMYDIA PROBE AMP ([facility]) NOT AT [facility]    EKG None  Radiology No results found.  Procedures Procedures (including critical care  time)  Medications Ordered in ED Medications  cefTRIAXone (ROCEPHIN) injection 500 mg (500 mg Intramuscular Given 02/12/20 1947)  lidocaine (PF) (XYLOCAINE) 1 % injection 1 mL (1 mL Other Given 02/12/20 1947)  metroNIDAZOLE (FLAGYL) tablet 500 mg (500 mg Oral Given 02/12/20 1947)  doxycycline (VIBRA-TABS) tablet 100 mg (100 mg Oral Given 02/12/20 1947)    ED Course  I  have reviewed the triage vital signs and the nursing notes.  Pertinent labs & imaging results that were available during my care of the patient were reviewed by me and considered in my medical decision making (see chart for details).    MDM Rules/Calculators/A&P                           History provided by patient with additional history obtained from chart review.     29 yo female presents with concerns for possible STD. Pt understands that they have GC/Chlamydia cultures pending and that they will need to inform all sexual partners if results return positive. Pt has been treated prophylacticly with doxycycline and rocephin due to pts history, pelvic exam, and wet prep with increased WBCs. Pt not concerning for PID because hemodynamically stable and no cervical motion tenderness on pelvic exam. Pt has also been treated with flagyl for Bacterial Vaginosis. Pt has been advised to not drink alcohol while on this medication.  Basic labs checked as patient complaining of heavy vaginal bleeding for 1 week.  CBC and BMP overall unremarkable.  UA without signs of infection.  She had negative pregnancy test in the ED x4 days ago.  Patient to be discharged with instructions to follow up with OBGYN/PCP. Discussed importance of using protection when sexually active.    Portions of this note were generated with Scientist, clinical (histocompatibility and immunogenetics). Dictation errors may occur despite best attempts at proofreading.    Final Clinical Impression(s) / ED Diagnoses Final diagnoses:  Concern about STD in female without diagnosis  Bacterial vaginosis     Rx / DC Orders ED Discharge Orders         Ordered    doxycycline (VIBRAMYCIN) 100 MG capsule  2 times daily        02/12/20 1937    metroNIDAZOLE (FLAGYL) 500 MG tablet  2 times daily        02/12/20 1937           Shanon Ace, PA-C 02/12/20 2001    Arby Barrette, MD 02/13/20 1349

## 2020-02-13 LAB — HIV ANTIBODY (ROUTINE TESTING W REFLEX): HIV Screen 4th Generation wRfx: NONREACTIVE

## 2020-02-13 LAB — RPR: RPR Ser Ql: NONREACTIVE

## 2020-02-13 LAB — GC/CHLAMYDIA PROBE AMP (~~LOC~~) NOT AT ARMC
Chlamydia: NEGATIVE
Comment: NEGATIVE
Comment: NORMAL
Neisseria Gonorrhea: NEGATIVE

## 2020-03-02 ENCOUNTER — Emergency Department (HOSPITAL_BASED_OUTPATIENT_CLINIC_OR_DEPARTMENT_OTHER): Payer: Self-pay

## 2020-03-02 ENCOUNTER — Emergency Department (HOSPITAL_BASED_OUTPATIENT_CLINIC_OR_DEPARTMENT_OTHER)
Admission: EM | Admit: 2020-03-02 | Discharge: 2020-03-02 | Disposition: A | Discharge status: Home / Self Care | Payer: Self-pay | Attending: Emergency Medicine | Admitting: Emergency Medicine

## 2020-03-02 ENCOUNTER — Encounter (HOSPITAL_BASED_OUTPATIENT_CLINIC_OR_DEPARTMENT_OTHER): Payer: Self-pay | Admitting: *Deleted

## 2020-03-02 ENCOUNTER — Other Ambulatory Visit: Payer: Self-pay

## 2020-03-02 DIAGNOSIS — Z859 Personal history of malignant neoplasm, unspecified: Secondary | ICD-10-CM | POA: Insufficient documentation

## 2020-03-02 DIAGNOSIS — Z87891 Personal history of nicotine dependence: Secondary | ICD-10-CM | POA: Insufficient documentation

## 2020-03-02 DIAGNOSIS — R059 Cough, unspecified: Secondary | ICD-10-CM

## 2020-03-02 DIAGNOSIS — Z91040 Latex allergy status: Secondary | ICD-10-CM | POA: Insufficient documentation

## 2020-03-02 DIAGNOSIS — R0982 Postnasal drip: Secondary | ICD-10-CM | POA: Insufficient documentation

## 2020-03-02 DIAGNOSIS — J45909 Unspecified asthma, uncomplicated: Secondary | ICD-10-CM | POA: Insufficient documentation

## 2020-03-02 MED ORDER — BENZONATATE 100 MG PO CAPS
100.0000 mg | ORAL_CAPSULE | Freq: Three times a day (TID) | ORAL | 0 refills | Status: AC
Start: 2020-03-02 — End: ?

## 2020-03-02 MED ORDER — PREDNISONE 10 MG PO TABS: 40.0000 mg | ORAL_TABLET | Freq: Every day | ORAL | 0 refills | Status: AC

## 2020-03-02 NOTE — Discharge Instructions (Addendum)
You were seen in the emergency department today for cough.  Your chest x-ray was normal.  We are sending you home with Tessalon to take every 8 hours as needed for coughing and prednisone which is a steroid to take daily for the next 5 days to help with inflammation and cough.  We have prescribed you new medication(s) today. Discuss the medications prescribed today with your pharmacist as they can have adverse effects and interactions with your other medicines including over the counter and prescribed medications. Seek medical evaluation if you start to experience new or abnormal symptoms after taking one of these medicines, seek care immediately if you start to experience difficulty breathing, feeling of your throat closing, facial swelling, or rash as these could be indications of a more serious allergic reaction  Follow-up with your primary care provider or the post Covid clinic within 3 days.  Return to the ER for new or worsening symptoms including but not limited to chest pain, return of fever, coughing up blood, passing out, trouble breathing, or any other concerns.

## 2020-03-02 NOTE — ED Provider Notes (Signed)
[facility] EMERGENCY DEPARTMENT Provider Note   CSN: [id number] Arrival date & time: 03/02/20  1223     History Chief Complaint  Patient presents with  . Cough    Alexis Melendez is a 29 y.o. female with a history of asthma who presents to the emergency department with persistent cough following COVID-19 diagnosis.  Patient states that she became ill about 2 weeks ago with fevers, chills, and cough.  Most of her symptoms are improved, however she remains with persistent coughing, has trouble getting a deep breath in without a coughing spell.  She is having some postnasal drip & wheezing.  Has tried NyQuil without relief.  Is often using her inhaler.  Denies current fever, rhinorrhea, sore throat, chest pain,  leg pain/swelling, hemoptysis, recent surgery/trauma, recent long travel, hormone use, personal hx of cancer, or hx of DVT/PE. Denies chance of pregnancy.   HPI     Past Medical History:  Diagnosis Date  . Asthma     There are no problems to display for this patient.   History reviewed. No pertinent surgical history.   OB History   No obstetric history on file.     No family history on file.  Social History   Tobacco Use  . Smoking status: Former Smoker    Packs/day: 0.50    Types: Cigarettes, Cigars  . Smokeless tobacco: Never Used  Vaping Use  . Vaping Use: Never used  Substance Use Topics  . Alcohol use: Yes    Comment: occasional  . Drug use: Not Currently    Types: Marijuana    Home Medications Prior to Admission medications   Medication Sig Start Date End Date Taking? Authorizing Provider  metroNIDAZOLE (FLAGYL) 500 MG tablet Take 1 tablet (500 mg total) by mouth 2 (two) times daily. 02/12/20   Shanon Ace, PA-C    Allergies    Black walnut flavor and Latex  Review of Systems   Review of Systems  Constitutional: Negative for chills and fever.  HENT: Positive for postnasal drip. Negative for congestion, ear pain and  tinnitus.   Respiratory: Positive for cough, shortness of breath and wheezing.   Cardiovascular: Negative for chest pain.  Gastrointestinal: Negative for abdominal pain, constipation, diarrhea, nausea and vomiting.  Genitourinary: Negative for dysuria.  All other systems reviewed and are negative.   Physical Exam Updated Vital Signs BP 109/73 (BP Location: Right Arm)   Pulse 90   Temp 99.3 F (37.4 C) (Oral)   Resp 20   Ht 5\' 4"  (1.626 m)   Wt 90.7 kg   LMP 03/02/2020   SpO2 100%   BMI 34.33 kg/m   Physical Exam Vitals and nursing note reviewed.  Constitutional:      General: She is not in acute distress.    Appearance: She is well-developed.  HENT:     Head: Normocephalic and atraumatic.     Right Ear: Ear canal normal. Tympanic membrane is not perforated, erythematous, retracted or bulging.     Left Ear: Ear canal normal. Tympanic membrane is not perforated, erythematous, retracted or bulging.     Ears:     Comments: No mastoid erythema/swelling/tenderness.     Nose:     Right Sinus: No maxillary sinus tenderness or frontal sinus tenderness.     Left Sinus: No maxillary sinus tenderness or frontal sinus tenderness.     Mouth/Throat:     Pharynx: Uvula midline. No oropharyngeal exudate or posterior oropharyngeal erythema.  Comments: Posterior oropharynx is symmetric appearing. Patient tolerating own secretions without difficulty. No trismus. No drooling. No hot potato voice. No swelling beneath the tongue, submandibular compartment is soft.  Eyes:     General:        Right eye: No discharge.        Left eye: No discharge.     Conjunctiva/sclera: Conjunctivae normal.     Pupils: Pupils are equal, round, and reactive to light.  Cardiovascular:     Rate and Rhythm: Normal rate and regular rhythm.     Heart sounds: No murmur heard.   Pulmonary:     Effort: Pulmonary effort is normal. No respiratory distress.     Breath sounds: Normal breath sounds. No wheezing,  rhonchi or rales.  Abdominal:     General: There is no distension.     Palpations: Abdomen is soft.     Tenderness: There is no abdominal tenderness.  Musculoskeletal:     Cervical back: Normal range of motion and neck supple. No edema or rigidity.     Right lower leg: No edema.     Left lower leg: No edema.  Lymphadenopathy:     Cervical: No cervical adenopathy.  Skin:    General: Skin is warm and dry.     Findings: No rash.  Neurological:     Mental Status: She is alert.  Psychiatric:        Behavior: Behavior normal.     ED Results / Procedures / Treatments   Labs (all labs ordered are listed, but only abnormal results are displayed) Labs Reviewed - No data to display  EKG None  Radiology DG Chest Portable 1 View  Result Date: 03/02/2020 CLINICAL DATA:  Shortness of breath and cough. Recent COVID-19 positive EXAM: PORTABLE CHEST 1 VIEW COMPARISON:  April 16, 2017 FINDINGS: The lungs are clear. The heart size and pulmonary vascularity are normal. No adenopathy. No bone lesions. IMPRESSION: Lungs clear.  Cardiac silhouette normal. Electronically Signed   By: Bretta Bang III M.D.   On: 03/02/2020 13:31    Procedures Procedures (including critical care time)  Medications Ordered in ED Medications - No data to display  ED Course  I have reviewed the triage vital signs and the nursing notes.  Pertinent labs & imaging results that were available during my care of the patient were reviewed by me and considered in my medical decision making (see chart for details).  Alexis Melendez was evaluated in Emergency Department on 03/02/2020 for the symptoms described in the history of present illness. He/she was evaluated in the context of the global COVID-19 pandemic, which necessitated consideration that the patient might be at risk for infection with the SARS-CoV-2 virus that causes COVID-19. Institutional protocols and algorithms that pertain to the evaluation of patients  at risk for COVID-19 are in a state of rapid change based on information released by regulatory bodies including the CDC and federal and state organizations. These policies and algorithms were followed during the patient's care in the ED.    MDM Rules/Calculators/A&P                          Patient presents to the ED with complaints of persistent cough.   Additional history obtained:  Additional history obtained from chart review & nursing note review.   Imaging Studies ordered:  I ordered imaging studies which included CXR, I independently visualized and interpreted imaging which showed Lungs clear.  Cardiac silhouette normal.   Patient's lungs are clear to auscultation bilaterally, no active wheezing at this time.  Her chest x-ray is negative for infiltrate, does not seem like an acute bacterial pneumonia.  She is low risk Wells, PERC negative. Likely continued post viral cough, she is having some wheezing as well- none in the ED, will trial steroids & tessalon. Ambulatory SpO2 100% on RA. Appears appropriate for discharge. I discussed results, treatment plan, need for follow-up, and return precautions with the patient. Provided opportunity for questions, patient confirmed understanding and is in agreement with plan.   Portions of this note were generated with Scientist, clinical (histocompatibility and immunogenetics). Dictation errors may occur despite best attempts at proofreading.  Final Clinical Impression(s) / ED Diagnoses Final diagnoses:  Cough    Rx / DC Orders ED Discharge Orders         Ordered    predniSONE (DELTASONE) 10 MG tablet  Daily        03/02/20 1750    benzonatate (TESSALON) 100 MG capsule  Every 8 hours        03/02/20 1750           Tilman Mcclaren, Pleas Koch, PA-C 03/02/20 1752    Charlynne Pander, MD 03/05/20 2306

## 2020-03-02 NOTE — ED Triage Notes (Signed)
Dx covid x 2 weeks ago c/o cough

## 2020-09-30 ENCOUNTER — Other Ambulatory Visit: Payer: Self-pay

## 2020-09-30 ENCOUNTER — Encounter (HOSPITAL_BASED_OUTPATIENT_CLINIC_OR_DEPARTMENT_OTHER): Payer: Self-pay

## 2020-09-30 ENCOUNTER — Emergency Department (HOSPITAL_BASED_OUTPATIENT_CLINIC_OR_DEPARTMENT_OTHER)
Admission: EM | Admit: 2020-09-30 | Discharge: 2020-09-30 | Disposition: A | Discharge status: Home / Self Care | Payer: Self-pay | Attending: Emergency Medicine | Admitting: Emergency Medicine

## 2020-09-30 DIAGNOSIS — Z91040 Latex allergy status: Secondary | ICD-10-CM | POA: Insufficient documentation

## 2020-09-30 DIAGNOSIS — Z87891 Personal history of nicotine dependence: Secondary | ICD-10-CM | POA: Insufficient documentation

## 2020-09-30 DIAGNOSIS — N760 Acute vaginitis: Secondary | ICD-10-CM | POA: Insufficient documentation

## 2020-09-30 DIAGNOSIS — N898 Other specified noninflammatory disorders of vagina: Secondary | ICD-10-CM

## 2020-09-30 DIAGNOSIS — B9689 Other specified bacterial agents as the cause of diseases classified elsewhere: Secondary | ICD-10-CM

## 2020-09-30 DIAGNOSIS — J45909 Unspecified asthma, uncomplicated: Secondary | ICD-10-CM | POA: Insufficient documentation

## 2020-09-30 LAB — URINALYSIS, ROUTINE W REFLEX MICROSCOPIC
Bilirubin Urine: NEGATIVE
Glucose, UA: NEGATIVE mg/dL
Hgb urine dipstick: NEGATIVE
Ketones, ur: NEGATIVE mg/dL
Leukocytes,Ua: NEGATIVE
Nitrite: NEGATIVE
Protein, ur: NEGATIVE mg/dL
Specific Gravity, Urine: >1.03 — ABNORMAL HIGH (ref 1.005–1.030)
pH: 5.5 (ref 5.0–8.0)

## 2020-09-30 LAB — WET PREP, GENITAL
Sperm: NONE SEEN
Trich, Wet Prep: NONE SEEN
Yeast Wet Prep HPF POC: NONE SEEN

## 2020-09-30 LAB — PREGNANCY, URINE: Preg Test, Ur: NEGATIVE

## 2020-09-30 MED ORDER — METRONIDAZOLE 500 MG PO TABS: 500.0000 mg | ORAL_TABLET | Freq: Two times a day (BID) | ORAL | 0 refills | Status: DC

## 2020-09-30 MED ORDER — CEFTRIAXONE SODIUM 1 G IJ SOLR
1.0000 g | Freq: Once | INTRAMUSCULAR | Status: AC
  Administered 2020-09-30: 1 g via INTRAMUSCULAR
  Filled 2020-09-30: qty 10

## 2020-09-30 MED ORDER — LIDOCAINE HCL (PF) 1 % IJ SOLN
2.1000 mL | Freq: Once | INTRAMUSCULAR | Status: AC
  Administered 2020-09-30: 2.1 mL
  Filled 2020-09-30: qty 5

## 2020-09-30 MED ORDER — AZITHROMYCIN 250 MG PO TABS
1000.0000 mg | ORAL_TABLET | Freq: Once | ORAL | Status: AC
  Administered 2020-09-30: 1000 mg via ORAL
  Filled 2020-09-30: qty 4

## 2020-09-30 NOTE — ED Provider Notes (Signed)
[facility] EMERGENCY DEPARTMENT Provider Note   CSN: [id number] Arrival date & time: 09/30/20  1152     History Chief Complaint  Patient presents with   Vaginal Discharge    Alexis Melendez is a 30 y.o. female.  Patient presents with chief complaint of vaginal discharge and injury while shaving 3 days ago.  She denies fevers or cough or vomiting or diarrhea.  Denies any abdominal pain.  She is sexually active with 1 partner who is new in the last 3 months.  She has a history of STD in the past.      Past Medical History:  Diagnosis Date   Asthma     There are no problems to display for this patient.   History reviewed. No pertinent surgical history.   OB History   No obstetric history on file.     No family history on file.  Social History   Tobacco Use   Smoking status: Former    Packs/day: 0.50    Types: Cigarettes, Cigars   Smokeless tobacco: Never  Vaping Use   Vaping Use: Former  Substance Use Topics   Alcohol use: Yes    Comment: occasional   Drug use: Not Currently    Home Medications Prior to Admission medications   Medication Sig Start Date End Date Taking? Authorizing Provider  benzonatate (TESSALON) 100 MG capsule Take 1 capsule (100 mg total) by mouth every 8 (eight) hours. 03/02/20   Petrucelli, Samantha R, PA-C  metroNIDAZOLE (FLAGYL) 500 MG tablet Take 1 tablet (500 mg total) by mouth 2 (two) times daily. 02/12/20   Shanon Ace, PA-C    Allergies    Black walnut flavor and Latex  Review of Systems   Review of Systems  Constitutional:  Negative for fever.  HENT:  Negative for ear pain.   Eyes:  Negative for pain.  Respiratory:  Negative for cough.   Cardiovascular:  Negative for chest pain.  Gastrointestinal:  Negative for abdominal pain.  Genitourinary:  Negative for flank pain.  Musculoskeletal:  Negative for back pain.  Skin:  Negative for rash.  Neurological:  Negative for headaches.   Physical  Exam Updated Vital Signs BP 116/63 (BP Location: Left Arm)   Pulse 72   Temp 98 F (36.7 C) (Oral)   Resp 18   LMP 09/03/2020   SpO2 100%   Physical Exam Constitutional:      General: She is not in acute distress.    Appearance: Normal appearance.  HENT:     Head: Normocephalic.     Nose: Nose normal.  Eyes:     Extraocular Movements: Extraocular movements intact.  Cardiovascular:     Rate and Rhythm: Normal rate.  Pulmonary:     Effort: Pulmonary effort is normal.  Genitourinary:    Comments: Lower pole occlusionPelvic exam performed with nursing chaperone present. In the dorsolithotomy position at the 5:00 location 9 cm ulcerative lesion.  No tenderness or pain on pelvic exam.  Moderate amount of discharge whitish appearing noted.  Sent for evaluation. Musculoskeletal:        General: Normal range of motion.     Cervical back: Normal range of motion.  Neurological:     General: No focal deficit present.     Mental Status: She is alert. Mental status is at baseline.    ED Results / Procedures / Treatments   Labs (all labs ordered are listed, but only abnormal results are displayed) Labs Reviewed  WET  PREP, GENITAL - Abnormal; Notable for the following components:      Result Value   Clue Cells Wet Prep HPF POC PRESENT (*)    WBC, Wet Prep HPF POC MANY (*)    All other components within normal limits  URINALYSIS, ROUTINE W REFLEX MICROSCOPIC - Abnormal; Notable for the following components:   Specific Gravity, Urine >1.030 (*)    All other components within normal limits  PREGNANCY, URINE  GC/CHLAMYDIA PROBE AMP ([place]) NOT AT [facility]    EKG None  Radiology No results found.  Procedures Procedures   Medications Ordered in ED Medications  cefTRIAXone (ROCEPHIN) injection 1 g (1 g Intramuscular Given 09/30/20 1301)  lidocaine (PF) (XYLOCAINE) 1 % injection 2.1 mL (2.1 mLs Other Given 09/30/20 1301)  azithromycin (ZITHROMAX) tablet 1,000 mg (1,000 mg  Oral Given 09/30/20 1302)    ED Course  I have reviewed the triage vital signs and the nursing notes.  Pertinent labs & imaging results that were available during my care of the patient were reviewed by me and considered in my medical decision making (see chart for details).    MDM Rules/Calculators/A&P                           Discussed with patient possibility of genital herpes given ulcerative lesion noted today.  Treated empirically for additional STDs.  Given a prescription of Flagyl for bacterial vaginosis found on wet mount.  [facility]  Advised return if she has fevers pain or additional concerns.  Advise no sexual intercourse until cleared of STDs.  Final Clinical Impression(s) / ED Diagnoses Final diagnoses:  BV (bacterial vaginosis)  Vaginal discharge    Rx / DC Orders ED Discharge Orders     None        Cheryll Cockayne, MD 09/30/20 1341

## 2020-09-30 NOTE — ED Notes (Signed)
Pt advised of wait to be seen-states she may leave-asked to let staff know if she decides to leave-agreed

## 2020-09-30 NOTE — ED Triage Notes (Signed)
Pt c/o vaginal d/c x 1-2 weeks-c/o "bump" to area after shaving 3 days ago-NAD-steady gait

## 2020-09-30 NOTE — Discharge Instructions (Addendum)
Your wet mount shows bacterial vaginosis again.  Take the antibiotics as written.  Do not have sex until you have been cleared of any infections.  Return to the ER if you have fever or any additional concerns.  Follow-up with your doctors within the week.

## 2020-10-01 LAB — GC/CHLAMYDIA PROBE AMP (~~LOC~~) NOT AT ARMC
Chlamydia: NEGATIVE
Comment: NEGATIVE
Comment: NORMAL
Neisseria Gonorrhea: NEGATIVE

## 2021-09-13 ENCOUNTER — Encounter (HOSPITAL_BASED_OUTPATIENT_CLINIC_OR_DEPARTMENT_OTHER): Payer: Self-pay | Admitting: Emergency Medicine

## 2021-09-13 ENCOUNTER — Emergency Department (HOSPITAL_BASED_OUTPATIENT_CLINIC_OR_DEPARTMENT_OTHER)
Admission: EM | Admit: 2021-09-13 | Discharge: 2021-09-13 | Disposition: A | Discharge status: Home / Self Care | Payer: Self-pay | Attending: Emergency Medicine | Admitting: Emergency Medicine

## 2021-09-13 ENCOUNTER — Other Ambulatory Visit: Payer: Self-pay

## 2021-09-13 DIAGNOSIS — N760 Acute vaginitis: Secondary | ICD-10-CM | POA: Insufficient documentation

## 2021-09-13 DIAGNOSIS — Z91040 Latex allergy status: Secondary | ICD-10-CM | POA: Insufficient documentation

## 2021-09-13 DIAGNOSIS — B9689 Other specified bacterial agents as the cause of diseases classified elsewhere: Secondary | ICD-10-CM | POA: Insufficient documentation

## 2021-09-13 LAB — URINALYSIS, ROUTINE W REFLEX MICROSCOPIC
Bilirubin Urine: NEGATIVE
Glucose, UA: NEGATIVE mg/dL
Hgb urine dipstick: NEGATIVE
Ketones, ur: NEGATIVE mg/dL
Leukocytes,Ua: NEGATIVE
Nitrite: NEGATIVE
Protein, ur: NEGATIVE mg/dL
Specific Gravity, Urine: >=1.03 (ref 1.005–1.030)
pH: 5 (ref 5.0–8.0)

## 2021-09-13 LAB — WET PREP, GENITAL
Sperm: NONE SEEN
Trich, Wet Prep: NONE SEEN
WBC, Wet Prep HPF POC: <10 (ref <10)
Yeast Wet Prep HPF POC: NONE SEEN

## 2021-09-13 LAB — PREGNANCY, URINE: Preg Test, Ur: NEGATIVE

## 2021-09-13 MED ORDER — METRONIDAZOLE 500 MG PO TABS: 500.0000 mg | ORAL_TABLET | Freq: Two times a day (BID) | ORAL | 0 refills | Status: AC

## 2021-09-13 MED ORDER — FLUCONAZOLE 150 MG PO TABS: 150.0000 mg | ORAL_TABLET | Freq: Every day | ORAL | 0 refills | Status: AC

## 2021-09-13 NOTE — Discharge Instructions (Addendum)
Follow-up on your STD test on your MyChart.  You do have bacterial vaginosis.  I sent metronidazole to pharmacy.  I also sent fluconazole if you feel like you get a yeast infection after the antibiotics.  Return with any fevers, chills or worsening symptoms.  Otherwise, follow-up with a primary care or OB/GYN if you have recurrent bacterial vaginosis.

## 2021-09-13 NOTE — ED Notes (Signed)
Discharge instructions reviewed with patient. Patient verbalizes understanding, no further questions at this time. Medications/prescriptions and follow up information provided. No acute distress noted at time of departure.  

## 2021-09-13 NOTE — ED Triage Notes (Signed)
Vag d/c and odor x 2 days with odor  ? Preg  hurts to pee states  prone to BV

## 2021-09-13 NOTE — ED Provider Notes (Signed)
[facility] EMERGENCY DEPARTMENT Provider Note   CSN: [id number] Arrival date & time: 09/13/21  8841     History  Chief Complaint  Patient presents with   Vaginal Discharge    Alexis Melendez is a 31 y.o. female senting with 3 days worth of odorous vaginal discharge.  Denies any vaginal itching.  No dysuria or hematuria.  No abdominal pain, nausea, vomiting, fever or chills.  Last menstrual period 6/8   Vaginal Discharge      Home Medications Prior to Admission medications   Medication Sig Start Date End Date Taking? Authorizing Provider  benzonatate (TESSALON) 100 MG capsule Take 1 capsule (100 mg total) by mouth every 8 (eight) hours. 03/02/20   Petrucelli, Samantha R, PA-C  metroNIDAZOLE (FLAGYL) 500 MG tablet Take 1 tablet (500 mg total) by mouth 2 (two) times daily. 09/30/20   Cheryll Cockayne, MD      Allergies    Black walnut flavor and Latex    Review of Systems   Review of Systems  Genitourinary:  Positive for vaginal discharge.    Physical Exam Updated Vital Signs BP 132/76 (BP Location: Left Arm)   Pulse 74   Temp 98.8 F (37.1 C) (Oral)   Resp 18   Ht 5\' 4"  (1.626 m)   Wt 104.3 kg   SpO2 100%   BMI 39.48 kg/m  Physical Exam Vitals and nursing note reviewed.  Constitutional:      Appearance: Normal appearance.  HENT:     Head: Normocephalic and atraumatic.  Eyes:     General: No scleral icterus.    Conjunctiva/sclera: Conjunctivae normal.  Pulmonary:     Effort: Pulmonary effort is normal. No respiratory distress.  Abdominal:     General: Abdomen is flat.     Palpations: Abdomen is soft.     Tenderness: There is no abdominal tenderness.  Genitourinary:    Comments: Patient self swab Skin:    Findings: No rash.  Neurological:     Mental Status: She is alert.  Psychiatric:        Mood and Affect: Mood normal.     ED Results / Procedures / Treatments   Labs (all labs ordered are listed, but only abnormal results are  displayed) Labs Reviewed  WET PREP, GENITAL  URINALYSIS, ROUTINE W REFLEX MICROSCOPIC  PREGNANCY, URINE  GC/CHLAMYDIA PROBE AMP ([place]) NOT AT [facility]    EKG None  Radiology No results found.  Procedures Procedures   Medications Ordered in ED Medications - No data to display  ED Course/ Medical Decision Making/ A&P                           Medical Decision Making Amount and/or Complexity of Data Reviewed Labs: ordered.  Risk Prescription drug management.   This patient presents to the ED for concern of vaginal discharge and odor.  Differential includes but is not limited to STI/PID, BV, yeast infection, pregnancy, UTI   This is not an exhaustive differential.    Past Medical History / Co-morbidities / Social History: Recurrent BV   Physical Exam: Agreed to self swab   Lab Tests: I ordered and reviewed patient's urinalysis.  No signs of pregnancy or UTI.  Wet prep is with clue cells.   Imaging Studies: No pain or worrisome presentations for torsion/ectopic.  No imaging indicated.    Disposition: 31 year old female presenting with vaginal discharge and odor.  Work-up consistent with BV.  Will be treated with antibiotics.  Low suspicion and no signs of PID/TOA.  Return precautions discussed.    Final Clinical Impression(s) / ED Diagnoses Final diagnoses:  BV (bacterial vaginosis)    Rx / DC Orders ED Discharge Orders          Ordered    metroNIDAZOLE (FLAGYL) 500 MG tablet  2 times daily        09/13/21 1115    fluconazole (DIFLUCAN) 150 MG tablet  Daily        09/13/21 1124           Results and diagnoses were explained to the patient. Return precautions discussed in full. Patient had no additional questions and expressed complete understanding.   This chart was dictated using voice recognition software.  Despite best efforts to proofread,  errors can occur which can change the documentation meaning.     Woodroe Chen 09/13/21  1127    Alvira Monday, MD 09/14/21 2354

## 2021-09-13 NOTE — ED Notes (Signed)
Pelvic cart at bedside. 

## 2021-09-14 LAB — GC/CHLAMYDIA PROBE AMP (~~LOC~~) NOT AT ARMC
Chlamydia: NEGATIVE
Comment: NEGATIVE
Comment: NORMAL
Neisseria Gonorrhea: NEGATIVE

## 2021-11-26 IMAGING — DX DG CHEST 1V PORT
1 series · 1 of 1 positions shown · non-contrast
Comparison: April 16, 2017

CLINICAL DATA: Shortness of breath and cough. Recent DEA79-DH
positive

EXAM:
PORTABLE CHEST 1 VIEW

[chest ap]
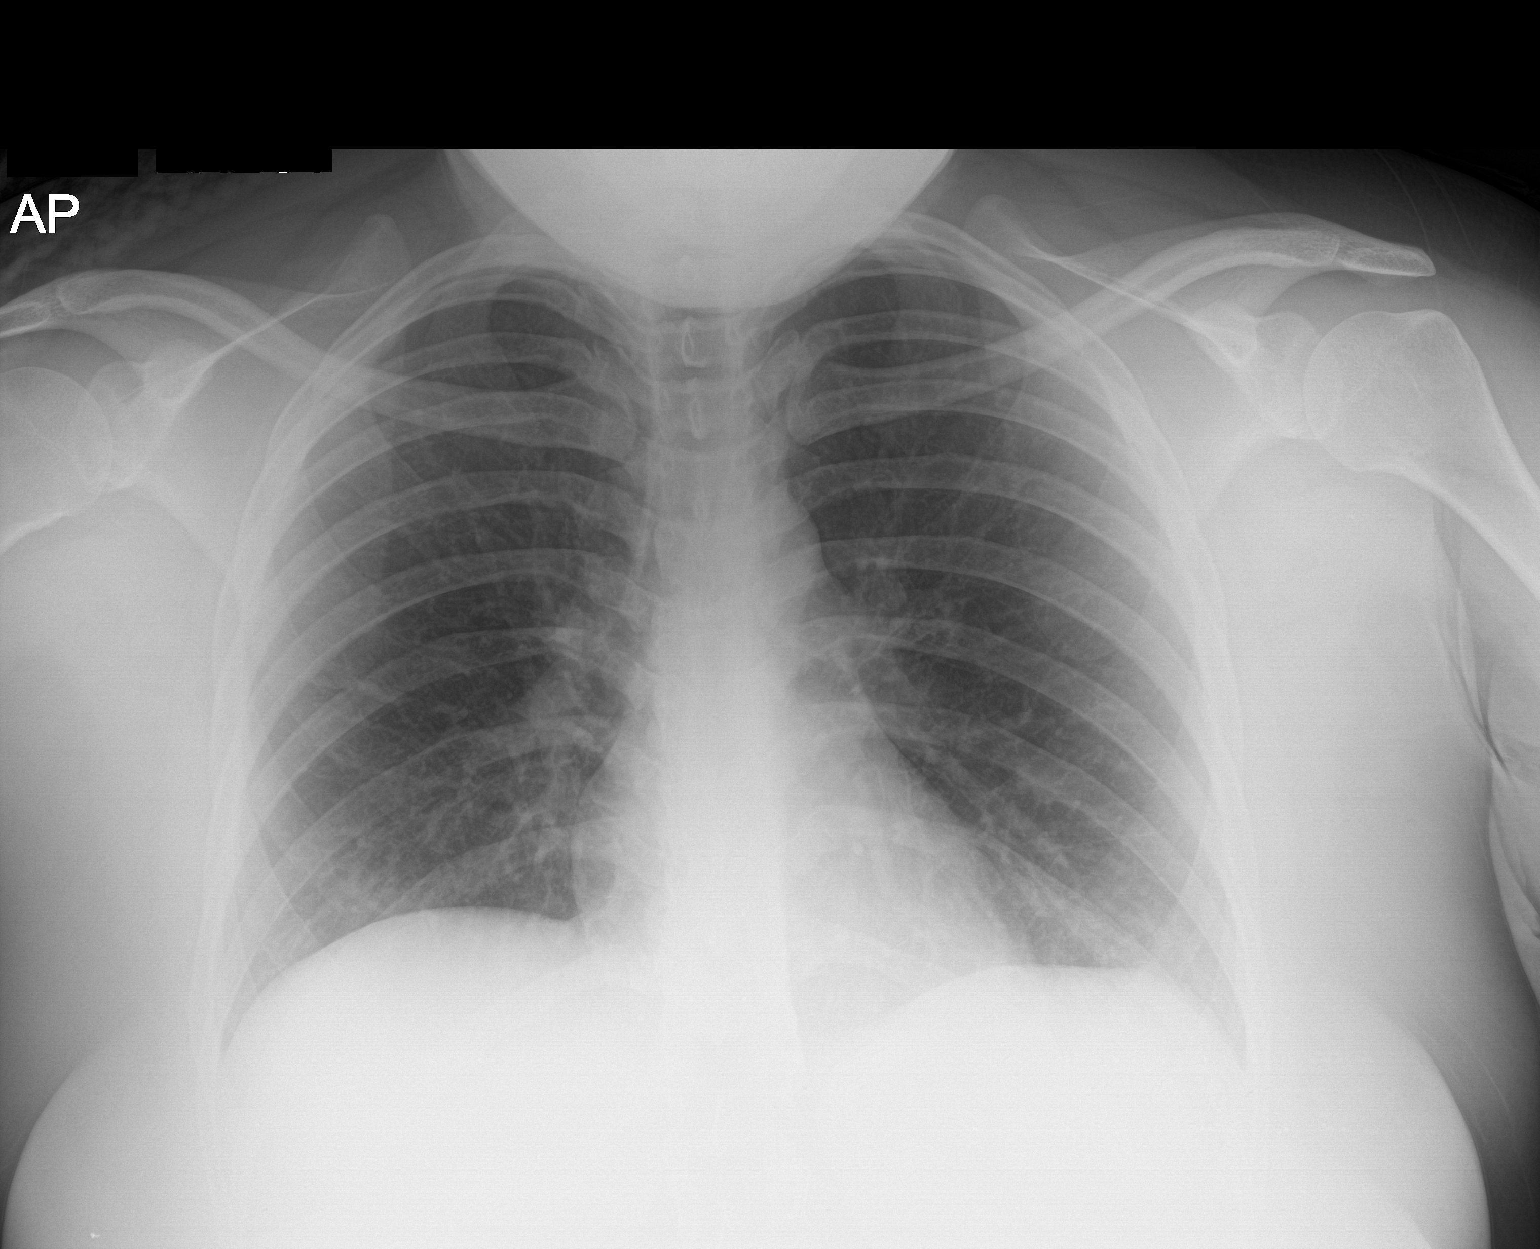

[1 of 1 positions shown; findings below may reference images not displayed]

FINDINGS: The lungs are clear. The heart size and pulmonary vascularity are
normal. No adenopathy. No bone lesions.
IMPRESSION: Lungs clear.  Cardiac silhouette normal.
